# Patient Record
Sex: Male | Born: 1955 | Race: White | Hispanic: No | Marital: Married | Smoking: Former smoker
Health system: Southern US, Community
[De-identification: ages and names within clinical notes are randomized; demographics above are authoritative.]

## PROBLEM LIST (undated history)

## (undated) DIAGNOSIS — I10 Essential (primary) hypertension: Secondary | ICD-10-CM

## (undated) DIAGNOSIS — F419 Anxiety disorder, unspecified: Secondary | ICD-10-CM

## (undated) DIAGNOSIS — K219 Gastro-esophageal reflux disease without esophagitis: Secondary | ICD-10-CM

## (undated) DIAGNOSIS — F32A Depression, unspecified: Secondary | ICD-10-CM

## (undated) DIAGNOSIS — S43004A Unspecified dislocation of right shoulder joint, initial encounter: Secondary | ICD-10-CM

## (undated) DIAGNOSIS — F329 Major depressive disorder, single episode, unspecified: Secondary | ICD-10-CM

## (undated) DIAGNOSIS — M1990 Unspecified osteoarthritis, unspecified site: Secondary | ICD-10-CM

## (undated) DIAGNOSIS — E7800 Pure hypercholesterolemia, unspecified: Secondary | ICD-10-CM

## (undated) HISTORY — PX: COLONOSCOPY: SHX174

---

## 2003-05-02 HISTORY — PX: ROTATOR CUFF REPAIR: SHX139

## 2003-08-19 ENCOUNTER — Ambulatory Visit (HOSPITAL_COMMUNITY): Admission: RE | Admit: 2003-08-19 | Discharge: 2003-08-19 | Payer: Self-pay | Admitting: Unknown Physician Specialty

## 2011-04-23 ENCOUNTER — Emergency Department (HOSPITAL_COMMUNITY): Payer: Self-pay

## 2011-04-23 ENCOUNTER — Encounter: Payer: Self-pay | Admitting: *Deleted

## 2011-04-23 ENCOUNTER — Emergency Department (HOSPITAL_COMMUNITY)
Admission: EM | Admit: 2011-04-23 | Discharge: 2011-04-23 | Disposition: A | Discharge status: Home / Self Care | Payer: Self-pay | Attending: Emergency Medicine | Admitting: Emergency Medicine

## 2011-04-23 DIAGNOSIS — R0989 Other specified symptoms and signs involving the circulatory and respiratory systems: Secondary | ICD-10-CM | POA: Insufficient documentation

## 2011-04-23 DIAGNOSIS — J3489 Other specified disorders of nose and nasal sinuses: Secondary | ICD-10-CM | POA: Insufficient documentation

## 2011-04-23 DIAGNOSIS — Z8639 Personal history of other endocrine, nutritional and metabolic disease: Secondary | ICD-10-CM | POA: Insufficient documentation

## 2011-04-23 DIAGNOSIS — R05 Cough: Secondary | ICD-10-CM | POA: Insufficient documentation

## 2011-04-23 DIAGNOSIS — F3289 Other specified depressive episodes: Secondary | ICD-10-CM | POA: Insufficient documentation

## 2011-04-23 DIAGNOSIS — Z862 Personal history of diseases of the blood and blood-forming organs and certain disorders involving the immune mechanism: Secondary | ICD-10-CM | POA: Insufficient documentation

## 2011-04-23 DIAGNOSIS — R093 Abnormal sputum: Secondary | ICD-10-CM | POA: Insufficient documentation

## 2011-04-23 DIAGNOSIS — E789 Disorder of lipoprotein metabolism, unspecified: Secondary | ICD-10-CM | POA: Insufficient documentation

## 2011-04-23 DIAGNOSIS — J189 Pneumonia, unspecified organism: Secondary | ICD-10-CM | POA: Insufficient documentation

## 2011-04-23 DIAGNOSIS — R062 Wheezing: Secondary | ICD-10-CM | POA: Insufficient documentation

## 2011-04-23 DIAGNOSIS — I10 Essential (primary) hypertension: Secondary | ICD-10-CM | POA: Insufficient documentation

## 2011-04-23 DIAGNOSIS — R0602 Shortness of breath: Secondary | ICD-10-CM | POA: Insufficient documentation

## 2011-04-23 DIAGNOSIS — R059 Cough, unspecified: Secondary | ICD-10-CM | POA: Insufficient documentation

## 2011-04-23 DIAGNOSIS — F329 Major depressive disorder, single episode, unspecified: Secondary | ICD-10-CM | POA: Insufficient documentation

## 2011-04-23 HISTORY — DX: Pure hypercholesterolemia, unspecified: E78.00

## 2011-04-23 HISTORY — DX: Depression, unspecified: F32.A

## 2011-04-23 HISTORY — DX: Essential (primary) hypertension: I10

## 2011-04-23 HISTORY — DX: Major depressive disorder, single episode, unspecified: F32.9

## 2011-04-23 MED ORDER — MOXIFLOXACIN HCL 400 MG PO TABS: 400.0000 mg | ORAL_TABLET | Freq: Every day | ORAL | Status: AC

## 2011-04-23 NOTE — ED Notes (Signed)
Pt d/c home with wife. Rx given and reviewed.

## 2011-04-23 NOTE — ED Provider Notes (Signed)
History   This chart was scribed for Toy Baker, MD by Charolett Bumpers. The patient was seen in room APA19/APA19 and the patient's care was started at 7:15am.  CSN: [id number]  Arrival date & time 04/23/11  4098   First MD Initiated Contact with Patient 04/23/11 [phone]      Chief Complaint  Patient presents with  . Cough    (Consider location/radiation/quality/duration/timing/severity/associated sxs/prior treatment) HPI Jeff Shaw is a 55 y.o. male who presents to the Emergency Department complaining of a moderate, constant cough that is productive with green/yellow sputum onset 5 days ago and persistent since with associated sinus drainage, wheezing, chest congestion and SOB. The SOB is made worse by lying flat and relieved by nothing. He denies fever, vomiting, diarrhea, leg swelling, and chest pain. Patient reports recent sick contacts with coworkers.  Pt denies taking any meds pta  Past Medical History  Diagnosis Date  . Hypertension   . High cholesterol   . Gout   . Depression     Past Surgical History  Procedure Date  . Rotator cuff repair     right shoulder    No family history on file.  History  Substance Use Topics  . Smoking status: Never Smoker   . Smokeless tobacco: Not on file  . Alcohol Use: No      Review of Systems A complete 10 system review of systems was obtained and is otherwise negative except as noted in the HPI and PMH.    Allergies  Review of patient's allergies indicates no known allergies.  Home Medications  No current outpatient prescriptions on file.  BP 149/93  Pulse 62  Temp(Src) 97.3 F (36.3 C) (Oral)  Resp 20  Ht 5\' 10"  (1.778 m)  Wt 190 lb (86.183 kg)  BMI 27.26 kg/m2  SpO2 97%  Physical Exam  Nursing note and vitals reviewed. Constitutional: He is oriented to person, place, and time. He appears well-developed and well-nourished. No distress.  HENT:  Head: Normocephalic and atraumatic.  Mouth/Throat:  Oropharynx is clear and moist.  Eyes: EOM are normal. Pupils are equal, round, and reactive to light.  Neck: Neck supple. No tracheal deviation present.  Cardiovascular: Normal rate and normal heart sounds.  Exam reveals no gallop and no friction rub.   No murmur heard. Pulmonary/Chest: Effort normal. No respiratory distress.       Rhonchi at right base.  Abdominal: Soft. He exhibits no distension.  Musculoskeletal: Normal range of motion. He exhibits no edema.  Lymphadenopathy:    He has no cervical adenopathy.  Neurological: He is alert and oriented to person, place, and time. No sensory deficit.  Skin: Skin is warm and dry.  Psychiatric: He has a normal mood and affect. His behavior is normal.    ED Course  Procedures (including critical care time) DIAGNOSTIC STUDIES: Oxygen Saturation is 97% on room air, normal by my interpretation.    COORDINATION OF CARE:     Labs Reviewed - No data to display Dg Chest 2 View  04/23/2011  *RADIOLOGY REPORT*  Clinical Data: Pain, cough.  CHEST - 2 VIEW  Comparison: None.  Findings: Lingular opacity, atelectasis versus infiltrate.  Right lung is clear.  Heart is borderline in size.  No effusions or acute bony abnormality.  IMPRESSION: Lingular atelectasis or pneumonia.  Original Report Authenticated By: Cyndie Chime, M.D.     No diagnosis found.    MDM  Patient's x-rays reviewed and findings consistent with pneumonia will  place patient on antibiotics for this  I personally performed the services described in this documentation, which was scribed in my presence. The recorded information has been reviewed and considered.       Toy Baker, MD 04/23/11 [phone]

## 2011-04-23 NOTE — ED Notes (Signed)
Increased cough, wheezing, increased sob when lying flat, and chest congestion x 5 days.  Denies fever.

## 2012-01-05 ENCOUNTER — Encounter (HOSPITAL_COMMUNITY): Payer: Self-pay | Admitting: *Deleted

## 2012-01-05 ENCOUNTER — Emergency Department (HOSPITAL_COMMUNITY)
Admission: EM | Admit: 2012-01-05 | Discharge: 2012-01-06 | Disposition: A | Discharge status: Home / Self Care | Payer: Self-pay | Attending: Emergency Medicine | Admitting: Emergency Medicine

## 2012-01-05 DIAGNOSIS — T46905A Adverse effect of unspecified agents primarily affecting the cardiovascular system, initial encounter: Secondary | ICD-10-CM | POA: Insufficient documentation

## 2012-01-05 DIAGNOSIS — I10 Essential (primary) hypertension: Secondary | ICD-10-CM | POA: Insufficient documentation

## 2012-01-05 DIAGNOSIS — T7840XA Allergy, unspecified, initial encounter: Secondary | ICD-10-CM | POA: Insufficient documentation

## 2012-01-05 DIAGNOSIS — M109 Gout, unspecified: Secondary | ICD-10-CM | POA: Insufficient documentation

## 2012-01-05 DIAGNOSIS — T783XXA Angioneurotic edema, initial encounter: Secondary | ICD-10-CM

## 2012-01-05 DIAGNOSIS — E7800 Pure hypercholesterolemia, unspecified: Secondary | ICD-10-CM | POA: Insufficient documentation

## 2012-01-05 MED ORDER — DIPHENHYDRAMINE HCL 50 MG/ML IJ SOLN
25.0000 mg | Freq: Once | INTRAMUSCULAR | Status: AC
  Administered 2012-01-05: 25 mg via INTRAVENOUS
  Filled 2012-01-05: qty 1

## 2012-01-05 MED ORDER — METHYLPREDNISOLONE SODIUM SUCC 125 MG IJ SOLR
125.0000 mg | Freq: Once | INTRAMUSCULAR | Status: AC
  Administered 2012-01-05: 125 mg via INTRAVENOUS
  Filled 2012-01-05: qty 2

## 2012-01-05 MED ORDER — FAMOTIDINE IN NACL 20-0.9 MG/50ML-% IV SOLN
20.0000 mg | Freq: Once | INTRAVENOUS | Status: AC
  Administered 2012-01-05: 20 mg via INTRAVENOUS
  Filled 2012-01-05: qty 50

## 2012-01-05 NOTE — ED Provider Notes (Signed)
History     CSN: [id number]  Arrival date & time 01/05/12  2233   First MD Initiated Contact with Patient 01/05/12 2241      Chief Complaint  Patient presents with  . Allergic Reaction    (Consider location/radiation/quality/duration/timing/severity/associated sxs/prior treatment) Patient is a 56 y.o. male presenting with allergic reaction. The history is provided by the patient (pt complains of swelling to face). No language interpreter was used.  Allergic Reaction The primary symptoms do not include wheezing, shortness of breath, cough, abdominal pain, diarrhea or rash. The current episode started less than 1 hour ago. The problem has not changed since onset.This is a new problem.  Associated with: nothing. Significant symptoms that are not present include eye redness.    Past Medical History  Diagnosis Date  . Hypertension   . High cholesterol   . Gout   . Depression     Past Surgical History  Procedure Date  . Rotator cuff repair     right shoulder    History reviewed. No pertinent family history.  History  Substance Use Topics  . Smoking status: Never Smoker   . Smokeless tobacco: Not on file  . Alcohol Use: No      Review of Systems  Constitutional: Negative for fatigue.  HENT: Positive for facial swelling. Negative for congestion, sinus pressure and ear discharge.   Eyes: Negative for discharge and redness.  Respiratory: Negative for cough, shortness of breath and wheezing.   Cardiovascular: Negative for chest pain.  Gastrointestinal: Negative for abdominal pain and diarrhea.  Genitourinary: Negative for frequency and hematuria.  Musculoskeletal: Negative for back pain.  Skin: Negative for rash.  Neurological: Negative for seizures and headaches.  Hematological: Negative.   Psychiatric/Behavioral: Negative for hallucinations.    Allergies  Review of patient's allergies indicates no known allergies.  Home Medications   Current Outpatient Rx    Name Route Sig Dispense Refill  . CITALOPRAM HYDROBROMIDE 10 MG PO TABS Oral Take 10 mg by mouth daily.    . IBUPROFEN 200 MG PO TABS Oral Take 600 mg by mouth every 6 (six) hours as needed. pain    . LISINOPRIL PO Oral Take 1 tablet by mouth daily. Patient unsure of strength    . PRAVASTATIN SODIUM 10 MG PO TABS Oral Take 10 mg by mouth daily.      BP 120/76  Pulse 76  Temp 98 F (36.7 C) (Oral)  Resp 20  SpO2 95%  Physical Exam  Constitutional: He is oriented to person, place, and time. He appears well-developed.  HENT:  Head: Normocephalic and atraumatic.       Pt has swelling to both lips and his chin and some swelling to tongue  Eyes: Conjunctivae and EOM are normal. No scleral icterus.  Neck: Neck supple. No thyromegaly present.  Cardiovascular: Normal rate and regular rhythm.  Exam reveals no gallop and no friction rub.   No murmur heard. Pulmonary/Chest: No stridor. He has no wheezes. He has no rales. He exhibits no tenderness.  Abdominal: He exhibits no distension. There is no tenderness. There is no rebound.  Musculoskeletal: Normal range of motion. He exhibits no edema.  Lymphadenopathy:    He has no cervical adenopathy.  Neurological: He is oriented to person, place, and time. Coordination normal.  Skin: No rash noted. No erythema.  Psychiatric: He has a normal mood and affect. His behavior is normal.    ED Course  Procedures (including critical care time)  Labs Reviewed -  No data to display No results found.   No diagnosis found.   Pt improved with tx MDM  Allergic reaction to lisinopril        Benny Lennert, MD 01/05/12 [phone]

## 2012-01-05 NOTE — ED Notes (Signed)
Pt has noted red raised hives on chest, bil arms and legs; Pt c/o itching and burning

## 2012-01-05 NOTE — ED Notes (Addendum)
Pt c/o sob, throat swelling, redness and hives. Pt took 2 benadryl just pta and 2 vitamin c tablets. Wife states pt used [place] for the first time tonight

## 2012-01-05 NOTE — ED Notes (Signed)
Pt has slight tongue swelling and noted lip swelling; Airway WNL at this time Pt sat 91% on RA 02 applied for pt comfort;Pt had soap rxn "[place]" Pt took 2 benadryl prior to arrival and pt re washed after soap exposure. MD made aware of pt arrival MD ot room to assess

## 2012-01-06 MED ORDER — PREDNISONE 10 MG PO TABS: 20.0000 mg | ORAL_TABLET | Freq: Every day | ORAL | Status: DC

## 2012-01-06 NOTE — ED Notes (Signed)
Pt had decreased redness with decreased hives noted No distress noted

## 2012-01-06 NOTE — ED Notes (Signed)
Pt comfortable No distress assessed, decreased hives assessed; Pt sitting up on side of bed drinking a drink; denies pain; 02 sats 97% on 2L 02 removed

## 2012-01-06 NOTE — ED Notes (Signed)
Pt alert & oriented x4, stable gait. Patient given discharge instructions, paperwork & prescription(s). Patient  instructed to stop at the registration desk to finish any additional paperwork. Patient verbalized understanding. Pt left department w/ no further questions. 

## 2012-01-06 NOTE — ED Provider Notes (Signed)
2330 Assumed care/disposition of patient. Patient with angioedema due to lisinopril. Given solumedrol, benadryl, pepcid. Patient has been observed in the ER for 3 hours. Swelling has improved. He was able to drink fluids. Pt stable in ED with no significant deterioration in condition.The patient appears reasonably screened and/or stabilized for discharge and I doubt any other medical condition or other [facility] requiring further screening, evaluation, or treatment in the ED at this time prior to discharge.  Nicoletta Dress. Colon Branch, MD 01/06/12 [phone]

## 2014-01-24 ENCOUNTER — Encounter (HOSPITAL_COMMUNITY): Payer: Self-pay | Admitting: Emergency Medicine

## 2014-01-24 ENCOUNTER — Emergency Department (HOSPITAL_COMMUNITY)
Admission: EM | Admit: 2014-01-24 | Discharge: 2014-01-24 | Disposition: A | Discharge status: Home / Self Care | Payer: BC Managed Care – PPO | Attending: Emergency Medicine | Admitting: Emergency Medicine

## 2014-01-24 ENCOUNTER — Emergency Department (HOSPITAL_COMMUNITY): Payer: BC Managed Care – PPO

## 2014-01-24 DIAGNOSIS — Z9889 Other specified postprocedural states: Secondary | ICD-10-CM | POA: Diagnosis not present

## 2014-01-24 DIAGNOSIS — Z79899 Other long term (current) drug therapy: Secondary | ICD-10-CM | POA: Diagnosis not present

## 2014-01-24 DIAGNOSIS — S43004A Unspecified dislocation of right shoulder joint, initial encounter: Secondary | ICD-10-CM

## 2014-01-24 DIAGNOSIS — S46909A Unspecified injury of unspecified muscle, fascia and tendon at shoulder and upper arm level, unspecified arm, initial encounter: Secondary | ICD-10-CM | POA: Insufficient documentation

## 2014-01-24 DIAGNOSIS — F3289 Other specified depressive episodes: Secondary | ICD-10-CM | POA: Diagnosis not present

## 2014-01-24 DIAGNOSIS — S43016A Anterior dislocation of unspecified humerus, initial encounter: Secondary | ICD-10-CM | POA: Insufficient documentation

## 2014-01-24 DIAGNOSIS — Y9389 Activity, other specified: Secondary | ICD-10-CM | POA: Insufficient documentation

## 2014-01-24 DIAGNOSIS — W010XXA Fall on same level from slipping, tripping and stumbling without subsequent striking against object, initial encounter: Secondary | ICD-10-CM | POA: Insufficient documentation

## 2014-01-24 DIAGNOSIS — M109 Gout, unspecified: Secondary | ICD-10-CM | POA: Insufficient documentation

## 2014-01-24 DIAGNOSIS — F329 Major depressive disorder, single episode, unspecified: Secondary | ICD-10-CM | POA: Diagnosis not present

## 2014-01-24 DIAGNOSIS — E7800 Pure hypercholesterolemia, unspecified: Secondary | ICD-10-CM | POA: Diagnosis not present

## 2014-01-24 DIAGNOSIS — Z791 Long term (current) use of non-steroidal anti-inflammatories (NSAID): Secondary | ICD-10-CM | POA: Diagnosis not present

## 2014-01-24 DIAGNOSIS — I10 Essential (primary) hypertension: Secondary | ICD-10-CM | POA: Diagnosis not present

## 2014-01-24 DIAGNOSIS — Y9289 Other specified places as the place of occurrence of the external cause: Secondary | ICD-10-CM | POA: Insufficient documentation

## 2014-01-24 DIAGNOSIS — S4980XA Other specified injuries of shoulder and upper arm, unspecified arm, initial encounter: Secondary | ICD-10-CM | POA: Insufficient documentation

## 2014-01-24 MED ORDER — HYDROCODONE-ACETAMINOPHEN 5-325 MG PO TABS: 1.0000 | ORAL_TABLET | ORAL | Status: DC | PRN

## 2014-01-24 MED ORDER — ONDANSETRON HCL 4 MG/2ML IJ SOLN
INTRAMUSCULAR | Status: AC
  Filled 2014-01-24: qty 2

## 2014-01-24 MED ORDER — HYDROMORPHONE HCL 1 MG/ML IJ SOLN
1.0000 mg | Freq: Once | INTRAMUSCULAR | Status: AC
  Administered 2014-01-24: 1 mg via INTRAVENOUS
  Filled 2014-01-24: qty 1

## 2014-01-24 MED ORDER — PROPOFOL 10 MG/ML IV BOLUS
INTRAVENOUS | Status: AC | PRN
  Administered 2014-01-24 (×2): 40 mg via INTRAVENOUS

## 2014-01-24 MED ORDER — PROPOFOL 10 MG/ML IV BOLUS
200.0000 mg | Freq: Once | INTRAVENOUS | Status: DC
  Filled 2014-01-24: qty 1

## 2014-01-24 MED ORDER — ONDANSETRON HCL 4 MG/2ML IJ SOLN
4.0000 mg | Freq: Once | INTRAMUSCULAR | Status: AC
  Administered 2014-01-24: 4 mg via INTRAVENOUS
  Filled 2014-01-24: qty 2

## 2014-01-24 MED ORDER — HYDROMORPHONE HCL 1 MG/ML IJ SOLN
INTRAMUSCULAR | Status: AC
  Filled 2014-01-24: qty 1

## 2014-01-24 MED ORDER — ONDANSETRON HCL 4 MG/2ML IJ SOLN
INTRAMUSCULAR | Status: DC | PRN
  Administered 2014-01-24: 4 mg via INTRAVENOUS

## 2014-01-24 MED ORDER — HYDROMORPHONE HCL 1 MG/ML IJ SOLN
INTRAMUSCULAR | Status: DC | PRN
  Administered 2014-01-24: 1 mg via INTRAVENOUS

## 2014-01-24 NOTE — ED Provider Notes (Signed)
CSN: [id number]     Arrival date & time 01/24/14  1045 History   First MD Initiated Contact with Patient 01/24/14 1056     Chief Complaint  Patient presents with  . Shoulder Pain     (Consider location/radiation/quality/duration/timing/severity/associated sxs/prior Treatment) The history is provided by the patient and the spouse.   Jeff Shaw is a 58 y.o. male right handed, with a history of right rotator cuff repair 7 years ago.  Since this event he has had intermittent episodes of shoulder dislocations, sometimes with simply raising his arm over his head, describing has already woke at night after reaching under his pillow with dislocation.  He can usually reduce it using gravity.  This morning he was pushing a lawn mower up an incline, his feet slipped causing a fall while still hanging onto the mower.  He has been unable to reduce the shoulder on his own.  He has had no medicines prior to arrival for this injury.  He denies weakness or numbness in his arm.     Past Medical History  Diagnosis Date  . Hypertension   . High cholesterol   . Gout   . Depression    Past Surgical History  Procedure Laterality Date  . Rotator cuff repair      right shoulder   History reviewed. No pertinent family history. History  Substance Use Topics  . Smoking status: Never Smoker   . Smokeless tobacco: Not on file  . Alcohol Use: Yes     Comment: occassionally     Review of Systems  Constitutional: Negative for fever.  Musculoskeletal: Positive for arthralgias. Negative for joint swelling and myalgias.  Neurological: Negative for weakness and numbness.      Allergies  Review of patient's allergies indicates no known allergies.  Home Medications   Prior to Admission medications   Medication Sig Start Date End Date Taking? Authorizing Provider  citalopram (CELEXA) 20 MG tablet Take 20 mg by mouth daily.   Yes Historical Provider, MD  lisinopril (PRINIVIL,ZESTRIL) 10 MG tablet Take  10 mg by mouth daily.   Yes Historical Provider, MD  simvastatin (ZOCOR) 40 MG tablet Take 40 mg by mouth daily.   Yes Historical Provider, MD  HYDROcodone-acetaminophen (NORCO/VICODIN) 5-325 MG per tablet Take 1 tablet by mouth every 4 (four) hours as needed. 01/24/14   Burgess Amor, PA-C  ibuprofen (ADVIL,MOTRIN) 200 MG tablet Take 600 mg by mouth every 6 (six) hours as needed. pain    Historical Provider, MD   BP 128/88  Pulse 60  Temp(Src) 97.8 F (36.6 C) (Oral)  Resp 14  Ht  (1.753 m)  Wt 185 lb (83.915 kg)  BMI 27.31 kg/m2  SpO2 94% Physical Exam  Constitutional: He appears well-developed and well-nourished.  HENT:  Head: Atraumatic.  Neck: Normal range of motion.  Cardiovascular:  Pulses equal bilaterally  Musculoskeletal: He exhibits tenderness.       Right shoulder: He exhibits decreased range of motion, bony tenderness and deformity. He exhibits no spasm and normal pulse.  Able to appreciated humeral head anteriorly.  Neurological: He is alert. He has normal strength. He displays normal reflexes. No sensory deficit. He exhibits normal muscle tone.  Equal grip strength bilaterally.  Radial pulses full and equal.  Skin: Skin is warm and dry.  Psychiatric: He has a normal mood and affect.    ED Course  Reduction of dislocation Date/Time: 01/24/2014 2:12 PM Performed by: Burgess Amor Authorized by: Burgess Amor Consent:  Verbal consent obtained. Risks and benefits: risks, benefits and alternatives were discussed Consent given by: patient Patient identity confirmed: verbally with patient Patient sedated: per EDP procedural sedation note. Patient tolerance: Patient tolerated the procedure well with no immediate complications. Comments: Shoulder easily reduced with traction and extension of the shoulder.  Shoulder immobilizer applied.   (including critical care time) Labs Review Labs Reviewed - No data to display  Imaging Review Dg Shoulder Right  01/24/2014    CLINICAL DATA:  Status post reduction.  EXAM: RIGHT SHOULDER - 2+ VIEW  COMPARISON:  Same day.  FINDINGS: Anterior dislocation noted on prior exam has been successfully reduced. No fracture is noted.  IMPRESSION: Successful reduction of anterior dislocation noted on prior exam.   Electronically Signed   By: Roque Lias M.D.   On: 01/24/2014 14:27   Dg Shoulder Right  01/24/2014   CLINICAL DATA:  RIGHT shoulder pain, limited range of motion, obvious deformity, slipped and fell on gravel, fell with arm hyper extended, prior rotator cuff surgery and shoulder dislocation  EXAM: RIGHT SHOULDER - 2+ VIEW  COMPARISON:  MRI RIGHT shoulder 08/19/2003  FINDINGS: Prior resection of distal RIGHT clavicle.  Anterior glenohumeral dislocation.  Hill-Sachs deformity of the proximal humerus.  No additional fracture or bone destruction.  Visualized ribs intact.  RIGHT basilar atelectasis.  IMPRESSION: Anterior RIGHT glenohumeral dislocation with Hill-Sachs impaction deformity of the proximal RIGHT humerus.   Electronically Signed   By: Ulyses Southward M.D.   On: 01/24/2014 12:23     EKG Interpretation None      MDM   Final diagnoses:  Shoulder dislocation, right, initial encounter    Pt placed in sling.  Advised ice,  Hydrocodone prescribed.  Referral to ortho for recheck of injury this week. Pt and wife at bedside agree with plan.    Burgess Amor, PA-C 01/24/14 1731

## 2014-01-24 NOTE — Discharge Instructions (Signed)
Shoulder Dislocation Your shoulder is made up of three bones: the collar bone (clavicle); the shoulder blade (scapula), which includes the socket (glenoid cavity); and the upper arm bone (humerus). Your shoulder joint is the place where these bones meet. Strong, fibrous tissues hold these bones together (ligaments). Muscles and strong, fibrous tissues that connect the muscles to these bones (tendons) allow your arm to move through this joint. The range of motion of your shoulder joint is more extensive than most of your other joints, and the glenoid cavity is very shallow. That is the reason that your shoulder joint is one of the most unstable joints in your body. It is far more prone to dislocation than your other joints. Shoulder dislocation is when your humerus is forced out of your shoulder joint. CAUSES Shoulder dislocation is caused by a forceful impact on your shoulder. This impact usually is from an injury, such as a sports injury or a fall. SYMPTOMS Symptoms of shoulder dislocation include:  Deformity of your shoulder.  Intense pain.  Inability to move your shoulder joint.  Numbness, weakness, or tingling around your shoulder joint (your neck or down your arm).  Bruising or swelling around your shoulder. DIAGNOSIS In order to diagnose a dislocated shoulder, your caregiver will perform a physical exam. Your caregiver also may have an X-ray exam done to see if you have any broken bones. Magnetic resonance imaging (MRI) is a procedure that sometimes is done to help your caregiver see any damage to the soft tissues around your shoulder, particularly your rotator cuff tendons. Additionally, your caregiver also may have electromyography done to measure the electrical discharges produced in your muscles if you have signs or symptoms of nerve damage. TREATMENT A shoulder dislocation is treated by placing the humerus back in the joint (reduction). Your caregiver does this either manually (closed  reduction), by moving your humerus back into the joint through manipulation, or through surgery (open reduction). When your humerus is back in place, severe pain should improve almost immediately. You also may need to have surgery if you have a weak shoulder joint or ligaments, and you have recurring shoulder dislocations, despite rehabilitation. In rare cases, surgery is necessary if your nerves or blood vessels are damaged during the dislocation. After your reduction, your arm will be placed in a shoulder immobilizer or sling to keep it from moving. Your caregiver will have you wear your shoulder immobilizer or sling for 3 days to 3 weeks, depending on how serious your dislocation is. When your shoulder immobilizer or sling is removed, your caregiver may prescribe physical therapy to help improve the range of motion in your shoulder joint. HOME CARE INSTRUCTIONS  The following measures can help to reduce pain and speed up the healing process:  Rest your injured joint. Do not move it. Avoid activities similar to the one that caused your injury.  Apply ice to your injured joint for the first day or two after your reduction or as directed by your caregiver. Applying ice helps to reduce inflammation and pain.  Put ice in a plastic bag.  Place a towel between your skin and the bag.  Leave the ice on for 15-20 minutes at a time, every 2 hours while you are awake.  Exercise your hand by squeezing a soft ball. This helps to eliminate stiffness and swelling in your hand and wrist.  Take over-the-counter or prescription medicine for pain or discomfort as told by your caregiver. SEEK IMMEDIATE MEDICAL CARE IF:   Your  shoulder immobilizer or sling becomes damaged.  Your pain becomes worse rather than better.  You lose feeling in your arm or hand, or they become white and cold. MAKE SURE YOU:   Understand these instructions.  Will watch your condition.  Will get help right away if you are not  doing well or get worse. Document Released: 01/10/2001 Document Revised: 09/01/2013 Document Reviewed: 02/05/2011 [facility] Patient Information 2015 [place], [place]. This information is not intended to replace advice given to you by your health care provider. Make sure you discuss any questions you have with your health care provider.  Wear your shoulder immobilizer to protect the joint from dislocating, which is at increased risk until the ligaments heal from today's trauma.  Call Dr. Romeo Apple this week for a recheck of your shoulder and further management of your recurrent dislocations.  You may take the hydrocodone prescribed for pain relief.  This will make you drowsy - do not drive within 4 hours of taking this medication.

## 2014-01-24 NOTE — ED Notes (Signed)
Pt states he was pushing his lawn mower a slipped on the gravel. He fell and landed on his right shoulder. Reports he feels like it is popped out of joint. With history of same.

## 2014-01-24 NOTE — ED Provider Notes (Signed)
Pt reports he had rotator cuff surgery about 7 years ago and afterward he had an injury where he fell backwards down some steps while holding onto a cart and dislocated both his shoulders which he was able to reduce himself.  He reports frequent episodes of his shoulders dislocating that he is able to reduce himself "using gravity" he states today someone was trying to get a lawnmower up somewhat cramps and he went to help push the lawnmower. His speech however slipped and he fell in his right shoulder dislocated. He states he's unable to get it back into joint today.  The patient has obvious step off of his right shoulder, he has good distal pulses.  Shoulder was reduced by PA Idol, pt placed in shoulder immobilizer afterwards. His shoulder feels like it has been reduced.   Procedural sedation Performed by: Devoria Albe L Consent: Verbal consent obtained. Risks and benefits: risks, benefits and alternatives were discussed Required items: required blood products, implants, devices, and special equipment available Patient identity confirmed: arm band and provided demographic data Time out: Immediately prior to procedure a "time out" was called to verify the correct patient, procedure, equipment, support staff and site/side marked as required.  Sedation type: moderate (conscious) sedation NPO time confirmed and considedered  Pt had episode of apnea, his pulse ox started to drop and BMV was initiated, his pulse ox dropped briefly to low 80's, but he responded rapidly to BMV. He then took a few breaths and woke up talking.   Sedatives: PROPOFOL  Physician Time at Bedside: 30 min  Vitals: Vital signs were monitored during sedation. Cardiac Monitor, pulse oximeter Patient tolerance: Patient tolerated the procedure well with no immediate complications. Comments: Pt with uneventful recovered. Returned to pre-procedural sedation baseline  Medical screening examination/treatment/procedure(s) were  conducted as a shared visit with non-physician practitioner(s) and myself.  I personally evaluated the patient during the encounter.   EKG Interpretation None       Devoria Albe, MD, Armando Gang   Ward Givens, MD 01/24/14 1330

## 2014-01-24 NOTE — ED Notes (Signed)
MD at bedside. 

## 2014-01-24 NOTE — ED Notes (Signed)
PA at bedside.

## 2014-01-25 NOTE — ED Provider Notes (Signed)
See prior note   Ward Givens, MD 01/25/14 [phone]

## 2014-01-26 ENCOUNTER — Encounter: Payer: Self-pay | Admitting: Orthopedic Surgery

## 2014-01-26 ENCOUNTER — Ambulatory Visit (INDEPENDENT_AMBULATORY_CARE_PROVIDER_SITE_OTHER): Payer: BC Managed Care – PPO | Admitting: Orthopedic Surgery

## 2014-01-26 ENCOUNTER — Ambulatory Visit (INDEPENDENT_AMBULATORY_CARE_PROVIDER_SITE_OTHER): Payer: BC Managed Care – PPO

## 2014-01-26 VITALS — BP 138/87 | Ht 69.0 in | Wt 185.0 lb

## 2014-01-26 DIAGNOSIS — IMO0002 Reserved for concepts with insufficient information to code with codable children: Secondary | ICD-10-CM

## 2014-01-26 DIAGNOSIS — S43004S Unspecified dislocation of right shoulder joint, sequela: Secondary | ICD-10-CM

## 2014-01-26 DIAGNOSIS — M24419 Recurrent dislocation, unspecified shoulder: Secondary | ICD-10-CM

## 2014-01-26 DIAGNOSIS — M24411 Recurrent dislocation, right shoulder: Secondary | ICD-10-CM

## 2014-01-26 MED ORDER — HYDROCODONE-ACETAMINOPHEN 5-325 MG PO TABS: 1.0000 | ORAL_TABLET | ORAL | Status: DC | PRN

## 2014-01-26 NOTE — Patient Instructions (Addendum)
Shoulder Dislocation Your shoulder is made up of three bones: the collar bone (clavicle); the shoulder blade (scapula), which includes the socket (glenoid cavity); and the upper arm bone (humerus). Your shoulder joint is the place where these bones meet. Strong, fibrous tissues hold these bones together (ligaments). Muscles and strong, fibrous tissues that connect the muscles to these bones (tendons) allow your arm to move through this joint. The range of motion of your shoulder joint is more extensive than most of your other joints, and the glenoid cavity is very shallow. That is the reason that your shoulder joint is one of the most unstable joints in your body. It is far more prone to dislocation than your other joints. Shoulder dislocation is when your humerus is forced out of your shoulder joint. CAUSES Shoulder dislocation is caused by a forceful impact on your shoulder. This impact usually is from an injury, such as a sports injury or a fall. SYMPTOMS Symptoms of shoulder dislocation include:  Deformity of your shoulder.  Intense pain.  Inability to move your shoulder joint.  Numbness, weakness, or tingling around your shoulder joint (your neck or down your arm).  Bruising or swelling around your shoulder. DIAGNOSIS In order to diagnose a dislocated shoulder, your caregiver will perform a physical exam. Your caregiver also may have an X-ray exam done to see if you have any broken bones. Magnetic resonance imaging (MRI) is a procedure that sometimes is done to help your caregiver see any damage to the soft tissues around your shoulder, particularly your rotator cuff tendons. Additionally, your caregiver also may have electromyography done to measure the electrical discharges produced in your muscles if you have signs or symptoms of nerve damage. TREATMENT A shoulder dislocation is treated by placing the humerus back in the joint (reduction). Your caregiver does this either manually (closed  reduction), by moving your humerus back into the joint through manipulation, or through surgery (open reduction). When your humerus is back in place, severe pain should improve almost immediately. You also may need to have surgery if you have a weak shoulder joint or ligaments, and you have recurring shoulder dislocations, despite rehabilitation. In rare cases, surgery is necessary if your nerves or blood vessels are damaged during the dislocation. After your reduction, your arm will be placed in a shoulder immobilizer or sling to keep it from moving. Your caregiver will have you wear your shoulder immobilizer or sling for 3 days to 3 weeks, depending on how serious your dislocation is. When your shoulder immobilizer or sling is removed, your caregiver may prescribe physical therapy to help improve the range of motion in your shoulder joint. HOME CARE INSTRUCTIONS  The following measures can help to reduce pain and speed up the healing process:  Rest your injured joint. Do not move it. Avoid activities similar to the one that caused your injury.  Apply ice to your injured joint for the first day or two after your reduction or as directed by your caregiver. Applying ice helps to reduce inflammation and pain.  Put ice in a plastic bag.  Place a towel between your skin and the bag.  Leave the ice on for 15-20 minutes at a time, every 2 hours while you are awake.  Exercise your hand by squeezing a soft ball. This helps to eliminate stiffness and swelling in your hand and wrist.  Take over-the-counter or prescription medicine for pain or discomfort as told by your caregiver. SEEK IMMEDIATE MEDICAL CARE IF:   Your  shoulder immobilizer or sling becomes damaged.  Your pain becomes worse rather than better.  You lose feeling in your arm or hand, or they become white and cold. MAKE SURE YOU:   Understand these instructions.  Will watch your condition.  Will get help right away if you are not  doing well or get worse. Document Released: 01/10/2001 Document Revised: 09/01/2013 Document Reviewed: 02/05/2011 [facility] Patient Information 2015 [place], [place]. This information is not intended to replace advice given to you by your health care provider. Make sure you discuss any questions you have with your health care provi   Immobilize 2 weeks Out of work 2 weeks

## 2014-01-26 NOTE — Progress Notes (Signed)
The patient new problem  Right-hand-dominant 58 year old male presents with history of recurrent shoulder dislocations. He's had 4 dislocations of his right shoulder the first 3 times he reduce the shoulder himself on this occasion 3 days ago on 01/24/2014 he fell in his shoulder came out of place and he couldn't reduce it.  He complains of numbness tingling, aching throbbing pain in the right shoulder which is constant. He rates his pain 5/10 and is on hydrocodone 5 mg as needed.  Previous treatment again did not including immobilization and he did self reduction. 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 His first shoulder dislocation occurred after that.  Medical history hypertension depression OCD ADHD arthritis  No other surgeries current medications lisinopril citalopram and simvastatin  History of review of systems indicating fatigue hearing loss frequent urination nausea depression joint pain muscle weakness lightheadedness tingling and numbness right upper extremity  Medication allergies none  Smoking history negative drinking history social drug history negative  Family history hypertension depression arthritis  Records reviewed ED notes  BP 138/87  Ht  (1.753 m)  Wt 185 lb (83.915 kg)  BMI 27.31 kg/m2  General appearance is normal orientation x3 is normal mood and affect are normal. Gait and station are normal. Right and left lower extremity inspection reveals no gross abnormalities in terms of alignment. There are no contractures, instability subluxations, atrophy or tremors. Neurovascular exam is intact  Left shoulder show some mild impingement signs but full range of motion normal strength stability alignment and skin is normal good pulse and temperature are noted normal sensation without lymphadenopathy in the axilla cervical spine or supraclavicular region  Right  shoulder has glenohumeral joint line tenderness anteriorly limited external rotation at 30 not sure if it's acute or chronic abduction 90 abduction external rotation 90-20 limited by pain muscle tone normal skin normal pulses normal lymph nodes normal sensation normal  Hoffman test is negative and coordination is normal  X-rays were done at the hospital they show a dislocated shoulder they show what appears to be a reduction I repeated the films with a Grashey view and a scapular Y-view showing reduction with humeral head defect and cyst formation in the greater tuberosity which may or may not be related to cuff repair  Multiple dislocating right shoulder most recent traumatic  Reduced in the ER  Recommend immobilization for 2 weeks then return for reexamination and reevaluation of rotator cuff or tear and then make determination regarding possible referral to address a Hill-Sachs lesion

## 2014-02-09 ENCOUNTER — Encounter: Payer: Self-pay | Admitting: Orthopedic Surgery

## 2014-02-09 ENCOUNTER — Other Ambulatory Visit: Payer: Self-pay | Admitting: *Deleted

## 2014-02-09 ENCOUNTER — Telehealth: Payer: Self-pay | Admitting: Orthopedic Surgery

## 2014-02-09 ENCOUNTER — Telehealth: Payer: Self-pay | Admitting: *Deleted

## 2014-02-09 ENCOUNTER — Ambulatory Visit (INDEPENDENT_AMBULATORY_CARE_PROVIDER_SITE_OTHER): Payer: BC Managed Care – PPO | Admitting: Orthopedic Surgery

## 2014-02-09 VITALS — BP 140/96 | Ht 69.0 in | Wt 185.0 lb

## 2014-02-09 DIAGNOSIS — M24411 Recurrent dislocation, right shoulder: Secondary | ICD-10-CM

## 2014-02-09 DIAGNOSIS — S43004S Unspecified dislocation of right shoulder joint, sequela: Secondary | ICD-10-CM

## 2014-02-09 NOTE — Telephone Encounter (Signed)
Patient has decided to move forward with scheduling the surgery discussed at his office visit today, 02/09/14.  Please advise.  Patient ph#'s are: [phone] [facility](Home) And [phone] [facility]([place])

## 2014-02-09 NOTE — Telephone Encounter (Signed)
Patient is being referred to Dr Dion SaucierLandau for surgery

## 2014-02-09 NOTE — Progress Notes (Signed)
Chief Complaint  Patient presents with  . Follow-up    2 week follow up Rt shoulder dislocation, DOI 01/24/14    Review of this patient's history this is a 58 year old right-hand-dominant male who's had multiple dislocations of his right shoulder presents with a history of dislocation on 01/24/2014. On that occasion he required manipulation in the emergency room under anesthesia. The 3 previous dislocations he reduced himself. He was noted at the time of his dislocation that has severe Hill-Sachs lesion.  After 2 weeks of immobilization he presented back for reevaluation  He denies any numbness or tingling he does note issues regarding where he can put his arm where he feels a feeling of possible dislocation  His rotator cuff strength today is very good I would grade 5. He has limited external rotation which is chronic and I measure it with his arm at his side 25. BP 140/96  Ht 5\' 9"  (1.753 m)  Wt 185 lb (83.915 kg)  BMI 27.31 kg/m2 He is awake alert and oriented x3 his mood and affect are normal he is somewhat anxious his gait and station are intact and normal as well. He has no tenderness or swelling over the shoulder. Pulse and perfusion are intact and sensation is normal  I discussed with him and his wife that he has a Hill-Sachs lesion and that when his shoulder rotates to a certain position it is likely to dislocate into the Hill-Sachs lesion.  I've advised him that he can live with his shoulder the way it is or we can send him for further evaluation with a shoulder subspecialist for evaluation for possible replacement or tendon transfer into the defect  [facility] Dr. Dion SaucierLandau

## 2014-02-09 NOTE — Telephone Encounter (Signed)
[facility]

## 2014-03-09 NOTE — Telephone Encounter (Signed)
Had appointment with DR Dion SaucierLANDAU 02/18/14

## 2014-03-10 ENCOUNTER — Encounter (HOSPITAL_BASED_OUTPATIENT_CLINIC_OR_DEPARTMENT_OTHER)
Admission: RE | Admit: 2014-03-10 | Discharge: 2014-03-10 | Disposition: A | Discharge status: Home / Self Care | Payer: BC Managed Care – PPO | Source: Ambulatory Visit | Attending: Orthopedic Surgery | Admitting: Orthopedic Surgery

## 2014-03-10 ENCOUNTER — Encounter (HOSPITAL_BASED_OUTPATIENT_CLINIC_OR_DEPARTMENT_OTHER): Payer: Self-pay | Admitting: *Deleted

## 2014-03-10 DIAGNOSIS — F329 Major depressive disorder, single episode, unspecified: Secondary | ICD-10-CM | POA: Diagnosis not present

## 2014-03-10 DIAGNOSIS — M24411 Recurrent dislocation, right shoulder: Secondary | ICD-10-CM | POA: Diagnosis not present

## 2014-03-10 DIAGNOSIS — M25311 Other instability, right shoulder: Secondary | ICD-10-CM | POA: Diagnosis not present

## 2014-03-10 DIAGNOSIS — W19XXXS Unspecified fall, sequela: Secondary | ICD-10-CM | POA: Diagnosis not present

## 2014-03-10 DIAGNOSIS — M109 Gout, unspecified: Secondary | ICD-10-CM | POA: Diagnosis not present

## 2014-03-10 DIAGNOSIS — E780 Pure hypercholesterolemia: Secondary | ICD-10-CM | POA: Diagnosis not present

## 2014-03-10 DIAGNOSIS — I10 Essential (primary) hypertension: Secondary | ICD-10-CM | POA: Diagnosis not present

## 2014-03-10 DIAGNOSIS — M75101 Unspecified rotator cuff tear or rupture of right shoulder, not specified as traumatic: Secondary | ICD-10-CM | POA: Diagnosis not present

## 2014-03-10 DIAGNOSIS — Z888 Allergy status to other drugs, medicaments and biological substances status: Secondary | ICD-10-CM | POA: Diagnosis not present

## 2014-03-10 LAB — BASIC METABOLIC PANEL
Anion gap: 15 (ref 5–15)
BUN: 9 mg/dL (ref 6–23)
CALCIUM: 9.7 mg/dL (ref 8.4–10.5)
CHLORIDE: 102 meq/L (ref 96–112)
CO2: 23 meq/L (ref 19–32)
CREATININE: 0.9 mg/dL (ref 0.50–1.35)
GFR calc Af Amer: >90 mL/min (ref >90)
GFR calc non Af Amer: >90 mL/min (ref >90)
GLUCOSE: 108 mg/dL — AB (ref 70–99)
Potassium: 4.7 mEq/L (ref 3.7–5.3)
Sodium: 140 mEq/L (ref 137–147)

## 2014-03-10 NOTE — Progress Notes (Signed)
Pt here for labs ekg-called dr Garner Nashdaniels office for notes

## 2014-03-13 ENCOUNTER — Ambulatory Visit (HOSPITAL_BASED_OUTPATIENT_CLINIC_OR_DEPARTMENT_OTHER): Payer: BC Managed Care – PPO | Admitting: Certified Registered"

## 2014-03-13 ENCOUNTER — Ambulatory Visit (HOSPITAL_BASED_OUTPATIENT_CLINIC_OR_DEPARTMENT_OTHER)
Admission: RE | Admit: 2014-03-13 | Discharge: 2014-03-13 | Disposition: A | Discharge status: Home / Self Care | Payer: BC Managed Care – PPO | Source: Ambulatory Visit | Attending: Orthopedic Surgery | Admitting: Orthopedic Surgery

## 2014-03-13 ENCOUNTER — Encounter (HOSPITAL_BASED_OUTPATIENT_CLINIC_OR_DEPARTMENT_OTHER)
Admission: RE | Disposition: A | Discharge status: Home / Self Care | Payer: Self-pay | Source: Ambulatory Visit | Attending: Orthopedic Surgery

## 2014-03-13 ENCOUNTER — Encounter (HOSPITAL_BASED_OUTPATIENT_CLINIC_OR_DEPARTMENT_OTHER): Payer: Self-pay | Admitting: Certified Registered"

## 2014-03-13 DIAGNOSIS — M25311 Other instability, right shoulder: Secondary | ICD-10-CM | POA: Insufficient documentation

## 2014-03-13 DIAGNOSIS — E780 Pure hypercholesterolemia: Secondary | ICD-10-CM | POA: Insufficient documentation

## 2014-03-13 DIAGNOSIS — M24411 Recurrent dislocation, right shoulder: Secondary | ICD-10-CM | POA: Insufficient documentation

## 2014-03-13 DIAGNOSIS — I10 Essential (primary) hypertension: Secondary | ICD-10-CM | POA: Insufficient documentation

## 2014-03-13 DIAGNOSIS — M75121 Complete rotator cuff tear or rupture of right shoulder, not specified as traumatic: Secondary | ICD-10-CM

## 2014-03-13 DIAGNOSIS — W19XXXS Unspecified fall, sequela: Secondary | ICD-10-CM | POA: Insufficient documentation

## 2014-03-13 DIAGNOSIS — M109 Gout, unspecified: Secondary | ICD-10-CM | POA: Insufficient documentation

## 2014-03-13 DIAGNOSIS — Z888 Allergy status to other drugs, medicaments and biological substances status: Secondary | ICD-10-CM | POA: Insufficient documentation

## 2014-03-13 DIAGNOSIS — M75101 Unspecified rotator cuff tear or rupture of right shoulder, not specified as traumatic: Secondary | ICD-10-CM | POA: Insufficient documentation

## 2014-03-13 DIAGNOSIS — S43004A Unspecified dislocation of right shoulder joint, initial encounter: Secondary | ICD-10-CM

## 2014-03-13 DIAGNOSIS — F329 Major depressive disorder, single episode, unspecified: Secondary | ICD-10-CM | POA: Insufficient documentation

## 2014-03-13 HISTORY — DX: Unspecified dislocation of right shoulder joint, initial encounter: S43.004A

## 2014-03-13 HISTORY — PX: SHOULDER ARTHROSCOPY WITH ROTATOR CUFF REPAIR AND SUBACROMIAL DECOMPRESSION: SHX5686

## 2014-03-13 HISTORY — PX: SHOULDER ARTHROSCOPY WITH BANKART REPAIR: SHX5673

## 2014-03-13 SURGERY — SHOULDER ARTHROSCOPY WITH BANKART REPAIR
Anesthesia: Regional | Site: Shoulder | Laterality: Right

## 2014-03-13 MED ORDER — LACTATED RINGERS IV SOLN
INTRAVENOUS | Status: DC
  Administered 2014-03-13 (×2): via INTRAVENOUS

## 2014-03-13 MED ORDER — MIDAZOLAM HCL 2 MG/2ML IJ SOLN
INTRAMUSCULAR | Status: AC
  Filled 2014-03-13: qty 2

## 2014-03-13 MED ORDER — GLYCOPYRROLATE 0.2 MG/ML IJ SOLN
INTRAMUSCULAR | Status: DC | PRN
  Administered 2014-03-13: 0.2 mg via INTRAVENOUS

## 2014-03-13 MED ORDER — HYDROMORPHONE HCL 1 MG/ML IJ SOLN
0.2500 mg | INTRAMUSCULAR | Status: DC | PRN
Start: 2014-03-13 — End: 2014-03-13

## 2014-03-13 MED ORDER — OXYCODONE HCL 5 MG/5ML PO SOLN: 5.0000 mg | Freq: Once | ORAL | Status: DC | PRN

## 2014-03-13 MED ORDER — SENNA-DOCUSATE SODIUM 8.6-50 MG PO TABS: 2.0000 | ORAL_TABLET | Freq: Every day | ORAL | Status: DC

## 2014-03-13 MED ORDER — CEFAZOLIN SODIUM-DEXTROSE 2-3 GM-% IV SOLR
2.0000 g | INTRAVENOUS | Status: AC
  Administered 2014-03-13: 2 g via INTRAVENOUS

## 2014-03-13 MED ORDER — BACLOFEN 10 MG PO TABS: 10.0000 mg | ORAL_TABLET | Freq: Three times a day (TID) | ORAL | Status: DC

## 2014-03-13 MED ORDER — FENTANYL CITRATE 0.05 MG/ML IJ SOLN
INTRAMUSCULAR | Status: AC
  Filled 2014-03-13: qty 2

## 2014-03-13 MED ORDER — ROPIVACAINE HCL 5 MG/ML IJ SOLN
INTRAMUSCULAR | Status: DC | PRN
  Administered 2014-03-13: 30 mL via PERINEURAL

## 2014-03-13 MED ORDER — PROPOFOL 10 MG/ML IV BOLUS
INTRAVENOUS | Status: DC | PRN
  Administered 2014-03-13: 150 mg via INTRAVENOUS

## 2014-03-13 MED ORDER — CEFAZOLIN SODIUM-DEXTROSE 2-3 GM-% IV SOLR
INTRAVENOUS | Status: AC
  Filled 2014-03-13: qty 50

## 2014-03-13 MED ORDER — FENTANYL CITRATE 0.05 MG/ML IJ SOLN
INTRAMUSCULAR | Status: AC
  Filled 2014-03-13: qty 6

## 2014-03-13 MED ORDER — SUCCINYLCHOLINE CHLORIDE 20 MG/ML IJ SOLN
INTRAMUSCULAR | Status: DC | PRN
  Administered 2014-03-13: 100 mg via INTRAVENOUS

## 2014-03-13 MED ORDER — DEXAMETHASONE SODIUM PHOSPHATE 4 MG/ML IJ SOLN
INTRAMUSCULAR | Status: DC | PRN
  Administered 2014-03-13: 10 mg via INTRAVENOUS

## 2014-03-13 MED ORDER — FENTANYL CITRATE 0.05 MG/ML IJ SOLN
50.0000 ug | INTRAMUSCULAR | Status: DC | PRN
  Administered 2014-03-13: 100 ug via INTRAVENOUS

## 2014-03-13 MED ORDER — ONDANSETRON HCL 4 MG/2ML IJ SOLN
INTRAMUSCULAR | Status: DC | PRN
  Administered 2014-03-13: 4 mg via INTRAVENOUS

## 2014-03-13 MED ORDER — MIDAZOLAM HCL 2 MG/2ML IJ SOLN
1.0000 mg | INTRAMUSCULAR | Status: DC | PRN
  Administered 2014-03-13: 2 mg via INTRAVENOUS

## 2014-03-13 MED ORDER — OXYCODONE-ACETAMINOPHEN 10-325 MG PO TABS: 1.0000 | ORAL_TABLET | Freq: Four times a day (QID) | ORAL | Status: DC | PRN

## 2014-03-13 MED ORDER — LIDOCAINE HCL (CARDIAC) 20 MG/ML IV SOLN
INTRAVENOUS | Status: DC | PRN
  Administered 2014-03-13: 30 mg via INTRAVENOUS

## 2014-03-13 MED ORDER — OXYCODONE HCL 5 MG PO TABS: 5.0000 mg | ORAL_TABLET | Freq: Once | ORAL | Status: DC | PRN

## 2014-03-13 MED ORDER — ONDANSETRON HCL 4 MG PO TABS: 4.0000 mg | ORAL_TABLET | Freq: Three times a day (TID) | ORAL | Status: DC | PRN

## 2014-03-13 MED ORDER — MIDAZOLAM HCL 5 MG/5ML IJ SOLN
INTRAMUSCULAR | Status: DC | PRN
  Administered 2014-03-13: 1 mg via INTRAVENOUS

## 2014-03-13 MED ORDER — SODIUM CHLORIDE 0.9 % IR SOLN
Status: DC | PRN
  Administered 2014-03-13: 21000 mL

## 2014-03-13 SURGICAL SUPPLY — 70 items
ANCHOR PEEK 4.75X19.1 SWLK C (Anchor) ×3 IMPLANT
ANCHOR SUT BIOCOMP LK 2.9X12.5 (Anchor) ×6 IMPLANT
BENZOIN TINCTURE PRP APPL 2/3 (GAUZE/BANDAGES/DRESSINGS) ×3 IMPLANT
BLADE 11 SAFETY STRL DISP (BLADE) IMPLANT
BLADE CUTTER GATOR 3.5 (BLADE) ×3 IMPLANT
BLADE GREAT WHITE 4.2 (BLADE) IMPLANT
BLADE GREAT WHITE 4.2MM (BLADE)
BLADE SURG 15 STRL LF DISP TIS (BLADE) IMPLANT
BLADE SURG 15 STRL SS (BLADE)
BLADE VORTEX 6.0 (BLADE) IMPLANT
BUR OVAL 4.0 (BURR) IMPLANT
BUR OVAL 6.0 (BURR) ×3 IMPLANT
CANNULA 5.75X71 LONG (CANNULA) ×3 IMPLANT
CANNULA TWIST IN 8.25X7CM (CANNULA) ×3 IMPLANT
CANNULA TWIST IN 8.25X9CM (CANNULA) IMPLANT
CLOSURE STERI-STRIP 1/2X4 (GAUZE/BANDAGES/DRESSINGS) ×1
CLSR STERI-STRIP ANTIMIC 1/2X4 (GAUZE/BANDAGES/DRESSINGS) ×2 IMPLANT
DECANTER SPIKE VIAL GLASS SM (MISCELLANEOUS) IMPLANT
DRAPE INCISE IOBAN 66X45 STRL (DRAPES) ×3 IMPLANT
DRAPE SHOULDER BEACH CHAIR (DRAPES) ×3 IMPLANT
DRAPE U 20/CS (DRAPES) ×3 IMPLANT
DRAPE U-SHAPE 47X51 STRL (DRAPES) ×3 IMPLANT
DRSG PAD ABDOMINAL 8X10 ST (GAUZE/BANDAGES/DRESSINGS) ×3 IMPLANT
DURAPREP 26ML APPLICATOR (WOUND CARE) ×3 IMPLANT
ELECT REM PT RETURN 9FT ADLT (ELECTROSURGICAL) ×3
ELECTRODE REM PT RTRN 9FT ADLT (ELECTROSURGICAL) ×1 IMPLANT
FIBERSTICK 2 (SUTURE) IMPLANT
GAUZE SPONGE 4X4 12PLY STRL (GAUZE/BANDAGES/DRESSINGS) ×3 IMPLANT
GLOVE BIO SURGEON STRL SZ8 (GLOVE) ×3 IMPLANT
GLOVE BIOGEL PI IND STRL 6.5 (GLOVE) ×2 IMPLANT
GLOVE BIOGEL PI IND STRL 8 (GLOVE) ×2 IMPLANT
GLOVE BIOGEL PI INDICATOR 6.5 (GLOVE) ×4
GLOVE BIOGEL PI INDICATOR 8 (GLOVE) ×4
GLOVE ECLIPSE 6.5 STRL STRAW (GLOVE) ×6 IMPLANT
GLOVE ORTHO TXT STRL SZ7.5 (GLOVE) ×3 IMPLANT
GOWN STRL REUS W/ TWL LRG LVL3 (GOWN DISPOSABLE) ×3 IMPLANT
GOWN STRL REUS W/ TWL XL LVL3 (GOWN DISPOSABLE) ×2 IMPLANT
GOWN STRL REUS W/TWL LRG LVL3 (GOWN DISPOSABLE) ×6
GOWN STRL REUS W/TWL XL LVL3 (GOWN DISPOSABLE) ×4
IMMOBILIZER SHOULDER FOAM XLGE (SOFTGOODS) IMPLANT
IV NS IRRIG 3000ML ARTHROMATIC (IV SOLUTION) ×6 IMPLANT
KIT PUSHLOCK 2.9 HIP (KITS) ×3 IMPLANT
KIT SHOULDER TRACTION (DRAPES) ×3 IMPLANT
LASSO CRESCENT QUICKPASS (SUTURE) ×3 IMPLANT
MANIFOLD NEPTUNE II (INSTRUMENTS) ×3 IMPLANT
NEEDLE SCORPION MULTI FIRE (NEEDLE) ×3 IMPLANT
PACK ARTHROSCOPY DSU (CUSTOM PROCEDURE TRAY) ×3 IMPLANT
PACK BASIN DAY SURGERY FS (CUSTOM PROCEDURE TRAY) ×3 IMPLANT
SET ARTHROSCOPY TUBING (MISCELLANEOUS) ×2
SET ARTHROSCOPY TUBING LN (MISCELLANEOUS) ×1 IMPLANT
SHEET MEDIUM DRAPE 40X70 STRL (DRAPES) ×3 IMPLANT
SLEEVE SCD COMPRESS KNEE MED (MISCELLANEOUS) ×3 IMPLANT
SLING ARM IMMOBILIZER LRG (SOFTGOODS) ×3 IMPLANT
SLING ARM IMMOBILIZER MED (SOFTGOODS) IMPLANT
SLING ARM LRG ADULT FOAM STRAP (SOFTGOODS) ×3 IMPLANT
SLING ARM MED ADULT FOAM STRAP (SOFTGOODS) IMPLANT
SLING ARM XL FOAM STRAP (SOFTGOODS) IMPLANT
SUT FIBERWIRE #2 38 T-5 BLUE (SUTURE) ×3
SUT MNCRL AB 4-0 PS2 18 (SUTURE) ×3 IMPLANT
SUT PDS AB 1 CT  36 (SUTURE)
SUT PDS AB 1 CT 36 (SUTURE) IMPLANT
SUT TIGER TAPE 7 IN WHITE (SUTURE) IMPLANT
SUT VIC AB 3-0 SH 27 (SUTURE)
SUT VIC AB 3-0 SH 27X BRD (SUTURE) IMPLANT
SUTURE FIBERWR #2 38 T-5 BLUE (SUTURE) ×1 IMPLANT
TAPE FIBER 2MM 7IN #2 BLUE (SUTURE) ×6 IMPLANT
TAPE SUT LABRALTAP WHT/BLK (SUTURE) ×6 IMPLANT
TOWEL OR 17X24 6PK STRL BLUE (TOWEL DISPOSABLE) ×3 IMPLANT
TOWEL OR NON WOVEN STRL DISP B (DISPOSABLE) ×6 IMPLANT
WATER STERILE IRR 1000ML POUR (IV SOLUTION) ×3 IMPLANT

## 2014-03-13 NOTE — Transfer of Care (Signed)
Immediate Anesthesia Transfer of Care Note  Patient: Jeff Shaw  Procedure(s) Performed: Procedure(s): RIGHT SHOULDER ARTHROSCOPY DEBRIDEMENT,BANKART,ACROMIOPLASTY,ROTATOR CUFF REPAIR (Right)  Patient Location: PACU  Anesthesia Type:GA combined with regional for post-op pain  Level of Consciousness: awake, alert , oriented and patient cooperative  Airway & Oxygen Therapy: Patient Spontanous Breathing and Patient connected to face mask oxygen  Post-op Assessment: Report given to PACU RN and Post -op Vital signs reviewed and stable  Post vital signs: Reviewed and stable  Complications: No apparent anesthesia complications

## 2014-03-13 NOTE — Anesthesia Postprocedure Evaluation (Signed)
  Anesthesia Post-op Note  Patient: Jeff Shaw  Procedure(s) Performed: Procedure(s): RIGHT SHOULDER ARTHROSCOPY DEBRIDEMENT,BANKART,ACROMIOPLASTY,ROTATOR CUFF REPAIR (Right)  Patient Location: PACU  Anesthesia Type: General, Regional   Level of Consciousness: awake, alert  and oriented  Airway and Oxygen Therapy: Patient Spontanous Breathing  Post-op Pain: none  Post-op Assessment: Post-op Vital signs reviewed  Post-op Vital Signs: Reviewed  Last Vitals:  Filed Vitals:   03/13/14 1515  BP: 124/73  Pulse: 69  Temp:   Resp: 16    Complications: No apparent anesthesia complications

## 2014-03-13 NOTE — Op Note (Signed)
03/13/2014  2:27 PM  PATIENT:  Jeff Shaw    PRE-OPERATIVE DIAGNOSIS:  Right shoulder recurrent dislocation, infraspinatus tear, impingement syndrome, history of rotator cuff repair  POST-OPERATIVE DIAGNOSIS:  Same  PROCEDURE:  Right shoulder arthroscopy with Bankart capsulorrhaphy and arthroscopic rotator cuff repair, revision, with acromioplasty and extensive debridement  SURGEON:  Eulas PostLANDAU,Jazon Jipson P, MD  PHYSICIAN ASSISTANT: Janace LittenBrandon Parry, OPA-C, present and scrubbed throughout the case, critical for completion in a timely fashion, and for retraction, instrumentation, and closure.  ANESTHESIA:   General  PREOPERATIVE INDICATIONS:  Jeff Shaw is a  58 y.o. male who had a previous right shoulder arthroscopy with open rotator cuff repair performed in 2005. In 2011 he fell and dislocated both shoulders in fact, although the right shoulder has had multiple recurrent dislocations since that time, total of 4 times.  Preoperative MRI demonstrated absent biceps tendon, large retracted rotator cuff tear involving the supraspinatus posteriorly, and a large portion of the infraspinatus. He elected for revision surgical intervention.  The risks benefits and alternatives were discussed with the patient preoperatively including but not limited to the risks of infection, bleeding, nerve injury, cardiopulmonary complications, the need for revision surgery, among others, and the patient was willing to proceed.  OPERATIVE IMPLANTS: Arthrex bio composite 2.9 mm push lock 2 for the glenoid and anterior capsule, with a simple FiberWire margin convergence stitch medially for the infraspinatus and supraspinatus, with a fiber tape laterally through the supraspinatus and infraspinatus placed into a PEEK swivel lock.  OPERATIVE FINDINGS: the shoulder was unstable anteriorly, and I could easily dislocate him. The subscapularis was intact and biceps tendon was absent. The glenohumeral articular cartilage was in  reasonably good condition. The supraspinatus had intact fibers anteriorly, but posteriorly had a large retracted tear of the infraspinatus. This extended up to the substance of the supraspinatus although there were still intact anterior fibers. There was moderate subacromial spurring, and overall tissue quality was poor. It was however reasonably mobile.  OPERATIVE PROCEDURE: the patient was brought to the operating room and placed in the supine position. Gen. Anesthesia was administered. IV antibiotics were given. Right upper time he was examined and found to be unstable anteriorly. He was then placed in a semilateral decubitus position and all bony prominences padded. The right upper extremity was prepped and draped in usual sterile fashion. Time out was performed. Diagnostic arthroscopy was carried out the above-named findings. I used the arthroscopic shaver to debride portions of the labrum as well as some chondral debris, and he had a fairly large Hill-Sachs lesion as well. The anterior glenoid face had maybe 15% missing bone, and the labrum was somewhat deficient anteriorly. The capsule was also fairly reflected off of the anterior face, and attached further back on the neck. Given this finding, I performed an anterior capsulorrhaphy. I did not really mobilize the labrum is much is just simply get capsular bites of tissue, and then repair this up onto the face. This provided excellent imbrication of the capsule and restoration of anterior soft tissue tension for the humeral head.  I then went to the subacromial space. I used the arthroscopic shaver to debride the infraspinatus tissue back to as stable configuration as possible. The tissue quality was extremely poor. Nonetheless I did get adequate tissue margins, and then used a bird beak suture passer from posteriorly to pass a horizontal mattress suture through the medial side of the V between the tendons, which was effectively a margin convergence  stitch. I used a  scorpion to pass the suture anteriorly.  I then tied this arthroscopically and had excellent tension, and then used a bird beak posteriorly for the infraspinatus laterally, and a scorpion anteriorly. I then took these fiber tape suture down into a anchor and effectively restored the tension on the rotator cuff reattaching this to the head. Excellent overall fixation was achieved and the rotator cuff now moved as a single unit.  I did perform a light acromioplasty, with CA ligament release, and I used a 6-0 bur. I debrided the bursal tissue, and poor quality cuff tissue, and then remove the arthroscopic instruments and repaired the portals with Monocryl followed by Steri-Strips and sterile gauze. He was awakened and returned to the PACU in stable and satisfactory condition. There were no complications and he tolerated the procedure well.

## 2014-03-13 NOTE — H&P (Signed)
PREOPERATIVE H&Shaw  Chief Complaint: Right shoulder pain, instability  HPI: Jeff Shaw is a 58 y.o. male Who had a past history of a right shoulder rotator cuff repair performed in 2005. In 2011 he fell and dislocated both of his shoulders. His right side is now dislocated a total of 4 times. He's had chronic severe pain, with instability. He is also had persistent apprehension, and difficulty functioning. He wakes up at nighttime, with pain located laterally, anteriorly, and he normally drives a bus. His last dislocation was 12/31/2013.  Past Medical History  Diagnosis Date  . Hypertension   . High cholesterol   . Gout   . Depression    Past Surgical History  Procedure Laterality Date  . Rotator cuff repair  2005    right shoulder  . Colonoscopy     History   Social History  . Marital Status: Married    Spouse Name: N/A    Number of Children: N/A  . Years of Education: N/A   Social History Main Topics  . Smoking status: Never Smoker   . Smokeless tobacco: None  . Alcohol Use: Yes     Comment: occassionally   . Drug Use: No  . Sexual Activity: None   Other Topics Concern  . None   Social History Narrative   History reviewed. No pertinent family history. Allergies  Allergen Reactions  . Lisinopril Swelling   Prior to Admission medications   Medication Sig Start Date End Date Taking? Authorizing Provider  metoprolol succinate (TOPROL-XL) 50 MG 24 hr tablet Take 50 mg by mouth daily. Take with or immediately following a meal.   Yes Historical Provider, MD  citalopram (CELEXA) 20 MG tablet Take 20 mg by mouth daily.    Historical Provider, MD  HYDROcodone-acetaminophen (NORCO/VICODIN) 5-325 MG per tablet Take 1 tablet by mouth every 4 (four) hours as needed. 01/26/14   Vickki HearingStanley E Harrison, MD  ibuprofen (ADVIL,MOTRIN) 200 MG tablet Take 600 mg by mouth every 6 (six) hours as needed. pain    Historical Provider, MD  simvastatin (ZOCOR) 40 MG tablet Take 40 mg by mouth  daily.    Historical Provider, MD     Positive ROS: All other systems have been reviewed and were otherwise negative with the exception of those mentioned in the HPI and as above.  Physical Exam: General: Alert, no acute distress Cardiovascular: No pedal edema Respiratory: No cyanosis, no use of accessory musculature GI: No organomegaly, abdomen is soft and non-tender Skin: No lesions in the area of chief complaint Neurologic: Sensation intact distally Psychiatric: Patient is competent for consent with normal mood and affect Lymphatic: No axillary or cervical lymphadenopathy  MUSCULOSKELETAL: Right shoulder active motion is 0-170 with positive apprehension, weakness with supraspinatus testing, moderately positive drop arm sign. No pain over the before meals joint.  Assessment: Right shoulder recurrent dislocation, with large if not massive retracted rotator cuff tear  Plan: Plan for Procedure(s): RIGHT SHOULDER ARTHROSCOPY DEBRIDEMENT,BANKART,ACROMIOPLASTY,ROTATOR CUFF REPAIR  The risks benefits and alternatives were discussed with the patient including but not limited to the risks of nonoperative treatment, versus surgical intervention including infection, bleeding, nerve injury,  blood clots, cardiopulmonary complications, morbidity, mortality, among others, and they were willing to proceed. We have also discussed the risks that this tear may be irreparable, in which case hopefully we can get at least a partial repair to confer some level of stability to his shoulder. This is an extremely difficult problem, with a revision surgery, and we  will do our best to achieve a functional outcome.  Jeff Shaw,Jeff Boshers P, MD Cell [phone]   03/13/2014 7:24 AM

## 2014-03-13 NOTE — Anesthesia Preprocedure Evaluation (Addendum)
Anesthesia Evaluation  Patient identified by MRN, date of birth, ID band Patient awake    Reviewed: Allergy & Precautions, H&P , NPO status , Patient's Chart, lab work & pertinent test results, reviewed documented beta blocker date and time   Airway Mallampati: II  TM Distance: >3 FB Neck ROM: Full    Dental no notable dental hx. (+) Teeth Intact, Dental Advisory Given   Pulmonary neg pulmonary ROS,  breath sounds clear to auscultation  Pulmonary exam normal       Cardiovascular hypertension, Pt. on home beta blockers negative cardio ROS  Rhythm:Regular Rate:Normal     Neuro/Psych Depression negative neurological ROS     GI/Hepatic negative GI ROS, Neg liver ROS,   Endo/Other  negative endocrine ROS  Renal/GU negative Renal ROS  negative genitourinary   Musculoskeletal   Abdominal   Peds  Hematology negative hematology ROS (+)   Anesthesia Other Findings   Reproductive/Obstetrics negative OB ROS                           Anesthesia Physical Anesthesia Plan  ASA: II  Anesthesia Plan: General and Regional   Post-op Pain Management:    Induction: Intravenous  Airway Management Planned: Oral ETT  Additional Equipment:   Intra-op Plan:   Post-operative Plan: Extubation in OR  Informed Consent: I have reviewed the patients History and Physical, chart, labs and discussed the procedure including the risks, benefits and alternatives for the proposed anesthesia with the patient or authorized representative who has indicated his/her understanding and acceptance.   Dental advisory given  Plan Discussed with: CRNA  Anesthesia Plan Comments:         Anesthesia Quick Evaluation

## 2014-03-13 NOTE — Progress Notes (Signed)
Assisted Dr. Edmond Fitzgerald with right, ultrasound guided, interscalene  block. Side rails up, monitors on throughout procedure. See vital signs in flow sheet. Tolerated Procedure well. 

## 2014-03-13 NOTE — Anesthesia Procedure Notes (Addendum)
Anesthesia Regional Block:  Interscalene brachial plexus block  Pre-Anesthetic Checklist: ,, timeout performed, Correct Patient, Correct Site, Correct Laterality, Correct Procedure, Correct Position, site marked, Risks and benefits discussed, pre-op evaluation,  At surgeon's request and post-op pain management  Laterality: Right  Prep: Maximum Sterile Barrier Precautions used and chloraprep       Needles:  Injection technique: Single-shot  Needle Type: Echogenic Stimulator Needle     Needle Length: 5cm 5 cm Needle Gauge: 22 and 22 G    Additional Needles:  Procedures: ultrasound guided (picture in chart) and nerve stimulator Interscalene brachial plexus block  Nerve Stimulator or Paresthesia:  Response: Biceps response,   Additional Responses:   Narrative:  Start time: 03/13/2014 11:27 AM End time: 03/13/2014 11:36 AM Injection made incrementally with aspirations every 5 mL.  Additional Notes: 2% Lidocaine skin wheel.

## 2014-03-13 NOTE — Discharge Instructions (Signed)
Diet: As you were doing prior to hospitalization   Shower:  May shower but keep the wounds dry, use an occlusive plastic wrap, NO SOAKING IN TUB.  If the bandage gets wet, change with a clean dry gauze.  Dressing:  You may change your dressing 3-5 days after surgery.  Then change the dressing daily with sterile gauze dressing.    There are sticky tapes (steri-strips) on your wounds and all the stitches are absorbable.  Leave the steri-strips in place when changing your dressings, they will peel off with time, usually 2-3 weeks.  Activity:  Increase activity slowly as tolerated, but follow the weight bearing instructions below.  No lifting or driving for 6 weeks.  Weight Bearing:   Sling at all times, no lifting with arm..    To prevent constipation: you may use a stool softener such as -  Colace (over the counter) 100 mg by mouth twice a day  Drink plenty of fluids (prune juice may be helpful) and high fiber foods Miralax (over the counter) for constipation as needed.    Itching:  If you experience itching with your medications, try taking only a single pain pill, or even half a pain pill at a time.  You may take up to 10 pain pills per day, and you can also use benadryl over the counter for itching or also to help with sleep.   Precautions:  If you experience chest pain or shortness of breath - call 911 immediately for transfer to the hospital emergency department!!  If you develop a fever greater that 101 F, purulent drainage from wound, increased redness or drainage from wound, or calf pain -- Call the office at [phone]                                                Follow- Up Appointment:  Please call for an appointment to be [place] in 2 [place] [phone]   Regional Anesthesia Blocks  1. Numbness or the inability to move the "blocked" extremity may last from 3-48 hours after placement. The length of time depends on the medication injected and your individual response  to the medication. If the numbness is not going away after 48 hours, call your surgeon.  2. The extremity that is blocked will need to be protected until the numbness is gone and the  Strength has returned. Because you cannot feel it, you will need to take extra care to avoid injury. Because it may be weak, you may have difficulty moving it or using it. You may not know what position it is in without looking at it while the block is in effect.  3. For blocks in the legs and feet, returning to weight bearing and walking needs to be done carefully. You will need to wait until the numbness is entirely gone and the strength has returned. You should be able to move your leg and foot normally before you try and bear weight or walk. You will need someone to be with you when you first try to ensure you do not fall and possibly risk injury.  4. Bruising and tenderness at the needle site are common side effects and will resolve in a few days.  5. Persistent numbness or new problems with movement should be communicated to the surgeon or the [facility] [phone])/  [facility] [phone]).    Post Anesthesia Home Care Instructions  Activity: Get plenty of rest for the remainder of the day. A responsible adult should stay with you for 24 hours following the procedure.  For the next 24 hours, DO NOT: -Drive a car -Advertising copywriterperate machinery -Drink alcoholic beverages -Take any medication unless instructed by your physician -Make any legal decisions or sign important papers.  Meals: Start with liquid foods such as gelatin or soup. Progress to regular foods as tolerated. Avoid greasy, spicy, heavy foods. If nausea and/or vomiting occur, drink only clear liquids until the nausea and/or vomiting subsides. Call your physician if vomiting continues.  Special Instructions/Symptoms: Your throat may feel dry or sore from the anesthesia or the breathing tube placed in your throat during  surgery. If this causes discomfort, gargle with warm salt water. The discomfort should disappear within 24 hours.

## 2014-03-16 ENCOUNTER — Encounter (HOSPITAL_BASED_OUTPATIENT_CLINIC_OR_DEPARTMENT_OTHER): Payer: Self-pay | Admitting: Orthopedic Surgery

## 2014-03-17 ENCOUNTER — Encounter (HOSPITAL_BASED_OUTPATIENT_CLINIC_OR_DEPARTMENT_OTHER): Payer: Self-pay | Admitting: Orthopedic Surgery

## 2014-09-26 ENCOUNTER — Encounter (HOSPITAL_COMMUNITY): Payer: Self-pay | Admitting: Emergency Medicine

## 2014-09-26 ENCOUNTER — Emergency Department (HOSPITAL_COMMUNITY)
Admission: EM | Admit: 2014-09-26 | Discharge: 2014-09-26 | Disposition: A | Discharge status: Home / Self Care | Payer: BLUE CROSS/BLUE SHIELD | Attending: Emergency Medicine | Admitting: Emergency Medicine

## 2014-09-26 DIAGNOSIS — I10 Essential (primary) hypertension: Secondary | ICD-10-CM | POA: Insufficient documentation

## 2014-09-26 DIAGNOSIS — R21 Rash and other nonspecific skin eruption: Secondary | ICD-10-CM | POA: Diagnosis present

## 2014-09-26 DIAGNOSIS — E780 Pure hypercholesterolemia: Secondary | ICD-10-CM | POA: Insufficient documentation

## 2014-09-26 DIAGNOSIS — Z79899 Other long term (current) drug therapy: Secondary | ICD-10-CM | POA: Insufficient documentation

## 2014-09-26 DIAGNOSIS — Z8739 Personal history of other diseases of the musculoskeletal system and connective tissue: Secondary | ICD-10-CM | POA: Insufficient documentation

## 2014-09-26 DIAGNOSIS — L509 Urticaria, unspecified: Secondary | ICD-10-CM | POA: Diagnosis not present

## 2014-09-26 DIAGNOSIS — F329 Major depressive disorder, single episode, unspecified: Secondary | ICD-10-CM | POA: Diagnosis not present

## 2014-09-26 MED ORDER — DIPHENHYDRAMINE HCL 50 MG/ML IJ SOLN
25.0000 mg | Freq: Once | INTRAMUSCULAR | Status: AC
  Administered 2014-09-26: 25 mg via INTRAVENOUS
  Filled 2014-09-26: qty 1

## 2014-09-26 MED ORDER — EPINEPHRINE 0.3 MG/0.3ML IJ SOAJ: 0.3000 mg | Freq: Once | INTRAMUSCULAR | Status: DC

## 2014-09-26 NOTE — ED Notes (Addendum)
Patient reports that he has been working outside today and when he started to take shower he started itching. Patient noticed hives on abd mostly in folds of skin. Patient also reports feeling like tongue is swelling and "some swelling in back of throat."  Denies any difficulty with swallowing or breathing. Patient able to handle oral secretions. Patient denies any new food, detergent, soaps, etc. Patient unsure if he came into contact with poison oak, ivy, or sumac.

## 2014-09-26 NOTE — ED Provider Notes (Signed)
CSN: [id number]     Arrival date & time 09/26/14  1828 History   First MD Initiated Contact with Patient 09/26/14 1851     Chief Complaint  Patient presents with  . Urticaria     (Consider location/radiation/quality/duration/timing/severity/associated sxs/prior Treatment) HPI   59ym with rash. Onset shortly before arrival. Was outside working. Came in and began to feeling itchy. Took a shower but symptoms only worsened. Red rash. "keeps moving." No respiratory or gi complaints. No contacts with similar symptoms. No obvious trigger.   Past Medical History  Diagnosis Date  . Hypertension   . High cholesterol   . Gout   . Depression   . Dislocation of right shoulder joint 03/13/2014   Past Surgical History  Procedure Laterality Date  . Rotator cuff repair  2005    right shoulder  . Colonoscopy    . Shoulder arthroscopy with bankart repair Right 03/13/2014    Procedure: RIGHT SHOULDER ARTHROSCOPY WITH DEBRIDEMENT AND BANKART REPAIR;  Surgeon: Eulas Post, MD;  Location: [facility];  Service: Orthopedics;  Laterality: Right;  . Shoulder arthroscopy with rotator cuff repair and subacromial decompression Right 03/13/2014    Procedure: ARTHROSCOPIC ROTATOR CUFF REPAIR AND SUBACROMIAL DECOMPRESSION;  Surgeon: Eulas Post, MD;  Location: [facility];  Service: Orthopedics;  Laterality: Right;   History reviewed. No pertinent family history. History  Substance Use Topics  . Smoking status: Never Smoker   . Smokeless tobacco: Never Used  . Alcohol Use: Yes     Comment: occassionally     Review of Systems  All systems reviewed and negative, other than as noted in HPI.   Allergies  Lisinopril  Home Medications   Prior to Admission medications   Medication Sig Start Date End Date Taking? Authorizing Provider  busPIRone (BUSPAR) 15 MG tablet Take 15 mg by mouth daily. 08/12/14  Yes Historical Provider, MD  citalopram (CELEXA) 20 MG tablet Take  20 mg by mouth daily.   Yes Historical Provider, MD  diphenhydrAMINE (BENADRYL) 25 MG tablet Take 50 mg by mouth every 6 (six) hours as needed for itching.   Yes Historical Provider, MD  metoprolol succinate (TOPROL-XL) 50 MG 24 hr tablet Take 50 mg by mouth daily. Take with or immediately following a meal.   Yes Historical Provider, MD  simvastatin (ZOCOR) 40 MG tablet Take 40 mg by mouth daily.   Yes Historical Provider, MD  baclofen (LIORESAL) 10 MG tablet Take 1 tablet (10 mg total) by mouth 3 (three) times daily. As needed for muscle spasm 03/13/14   Teryl Lucy, MD  ondansetron (ZOFRAN) 4 MG tablet Take 1 tablet (4 mg total) by mouth every 8 (eight) hours as needed for nausea or vomiting. 03/13/14   Teryl Lucy, MD  oxyCODONE-acetaminophen (PERCOCET) 10-325 MG per tablet Take 1-2 tablets by mouth every 6 (six) hours as needed for pain. MAXIMUM TOTAL ACETAMINOPHEN DOSE IS 4000 MG PER DAY 03/13/14   Teryl Lucy, MD  sennosides-docusate sodium (SENOKOT-S) 8.6-50 MG tablet Take 2 tablets by mouth daily. 03/13/14   Teryl Lucy, MD   BP 149/93 mmHg  Pulse 73  Temp(Src) 98 F (36.7 C) (Oral)  Resp 16  Ht  (1.778 m)  Wt 187 lb (84.823 kg)  BMI 26.83 kg/m2  SpO2 97% Physical Exam  Constitutional: He appears well-developed and well-nourished. No distress.  HENT:  Head: Normocephalic and atraumatic.  Eyes: Conjunctivae are normal. Right eye exhibits no discharge. Left eye exhibits no  discharge.  Neck: Neck supple.  Cardiovascular: Normal rate, regular rhythm and normal heart sounds.  Exam reveals no gallop and no friction rub.   No murmur heard. Pulmonary/Chest: Effort normal and breath sounds normal. No respiratory distress.  Abdominal: Soft. He exhibits no distension. There is no tenderness.  Musculoskeletal: He exhibits no edema or tenderness.  Neurological: He is alert.  Skin: Skin is warm and dry. Rash noted.  Generalized urticarial rash.   Psychiatric: He has a normal  mood and affect. His behavior is normal. Thought content normal.  Nursing note and vitals reviewed.   ED Course  Procedures (including critical care time) Labs Review Labs Reviewed - No data to display  Imaging Review No results found.   EKG Interpretation None      MDM   Final diagnoses:  Urticaria    59yM with urticaria. Unclear trigger. No respiratory complaints, GI complaints or HD instability. No progression of symptoms during time in ED. Would treat symptoms PRN with antihistamines at this point. Return precautions discussed.     Raeford RazorStephen Bartley Vuolo, MD 10/08/14 1400

## 2014-09-26 NOTE — ED Notes (Signed)
Discharge instructions given, pt demonstrated teach back and verbal understanding. No concerns voiced.  

## 2014-09-26 NOTE — Discharge Instructions (Signed)
Hives Hives are itchy, red, swollen areas of the skin. They can vary in size and location on your body. Hives can come and go for hours or several days (acute hives) or for several weeks (chronic hives). Hives do not spread from person to person (noncontagious). They may get worse with scratching, exercise, and emotional stress. CAUSES   Allergic reaction to food, additives, or drugs.  Infections, including the common cold.  Illness, such as vasculitis, lupus, or thyroid disease.  Exposure to sunlight, heat, or cold.  Exercise.  Stress.  Contact with chemicals. SYMPTOMS   Red or white swollen patches on the skin. The patches may change size, shape, and location quickly and repeatedly.  Itching.  Swelling of the hands, feet, and face. This may occur if hives develop deeper in the skin. DIAGNOSIS  Your caregiver can usually tell what is wrong by performing a physical exam. Skin or blood tests may also be done to determine the cause of your hives. In some cases, the cause cannot be determined. TREATMENT  Mild cases usually get better with medicines such as antihistamines. Severe cases may require an emergency epinephrine injection. If the cause of your hives is known, treatment includes avoiding that trigger.  HOME CARE INSTRUCTIONS   Avoid causes that trigger your hives.  Take antihistamines as directed by your caregiver to reduce the severity of your hives. Non-sedating or low-sedating antihistamines are usually recommended. Do not drive while taking an antihistamine.  Take any other medicines prescribed for itching as directed by your caregiver.  Wear loose-fitting clothing.  Keep all follow-up appointments as directed by your caregiver. SEEK MEDICAL CARE IF:   You have persistent or severe itching that is not relieved with medicine.  You have painful or swollen joints. SEEK IMMEDIATE MEDICAL CARE IF:   You have a fever.  Your tongue or lips are swollen.  You have  trouble breathing or swallowing.  You feel tightness in the throat or chest.  You have abdominal pain. These problems may be the first sign of a life-threatening allergic reaction. Call your local emergency services (911 in [place] MAKE SURE YOU:   Understand these instructions.  Will watch your condition.  Will get help right away if you are not doing well or get worse. Document Released: 04/17/2005 Document Revised: 04/22/2013 Document Reviewed: 07/11/2011 ExitCare Patient Information 2015 [employer]. This information is not intended to replace advice given to you by your health care provider. Make sure you discuss any questions you have with your health care provider.  

## 2014-09-26 NOTE — ED Notes (Signed)
Hives diffusely noted to body. Patient states "funny feeling in throat" No respiratory distress. Speaks in full sentences.

## 2017-05-25 ENCOUNTER — Encounter (HOSPITAL_COMMUNITY): Payer: Self-pay | Admitting: *Deleted

## 2017-05-25 ENCOUNTER — Emergency Department (HOSPITAL_COMMUNITY)
Admission: EM | Admit: 2017-05-25 | Discharge: 2017-05-25 | Disposition: A | Discharge status: Home / Self Care | Payer: BLUE CROSS/BLUE SHIELD | Attending: Emergency Medicine | Admitting: Emergency Medicine

## 2017-05-25 DIAGNOSIS — Z79899 Other long term (current) drug therapy: Secondary | ICD-10-CM | POA: Insufficient documentation

## 2017-05-25 DIAGNOSIS — R600 Localized edema: Secondary | ICD-10-CM | POA: Insufficient documentation

## 2017-05-25 DIAGNOSIS — T7840XA Allergy, unspecified, initial encounter: Secondary | ICD-10-CM | POA: Insufficient documentation

## 2017-05-25 DIAGNOSIS — I10 Essential (primary) hypertension: Secondary | ICD-10-CM | POA: Insufficient documentation

## 2017-05-25 DIAGNOSIS — R1310 Dysphagia, unspecified: Secondary | ICD-10-CM | POA: Insufficient documentation

## 2017-05-25 MED ORDER — FAMOTIDINE IN NACL 20-0.9 MG/50ML-% IV SOLN
INTRAVENOUS | Status: AC
  Administered 2017-05-25: 20 mg via INTRAVENOUS
  Filled 2017-05-25: qty 50

## 2017-05-25 MED ORDER — PREDNISONE 10 MG PO TABS: 40.0000 mg | ORAL_TABLET | Freq: Every day | ORAL | 0 refills | Status: DC

## 2017-05-25 MED ORDER — DIPHENHYDRAMINE HCL 25 MG PO TABS: 25.0000 mg | ORAL_TABLET | Freq: Four times a day (QID) | ORAL | 0 refills | Status: DC

## 2017-05-25 MED ORDER — METHYLPREDNISOLONE SODIUM SUCC 125 MG IJ SOLR
125.0000 mg | Freq: Once | INTRAMUSCULAR | Status: AC
  Administered 2017-05-25: 125 mg via INTRAVENOUS

## 2017-05-25 MED ORDER — METHYLPREDNISOLONE SODIUM SUCC 125 MG IJ SOLR
INTRAMUSCULAR | Status: AC
  Administered 2017-05-25: 125 mg via INTRAVENOUS
  Filled 2017-05-25: qty 2

## 2017-05-25 MED ORDER — FAMOTIDINE IN NACL 20-0.9 MG/50ML-% IV SOLN
20.0000 mg | Freq: Once | INTRAVENOUS | Status: AC
  Administered 2017-05-25: 20 mg via INTRAVENOUS

## 2017-05-25 MED ORDER — DIPHENHYDRAMINE HCL 50 MG/ML IJ SOLN
INTRAMUSCULAR | Status: AC
  Filled 2017-05-25: qty 1

## 2017-05-25 MED ORDER — FAMOTIDINE 20 MG PO TABS: 20.0000 mg | ORAL_TABLET | Freq: Two times a day (BID) | ORAL | 0 refills | Status: DC

## 2017-05-25 NOTE — ED Triage Notes (Addendum)
Pt states he was eating a frozen apple pie when he noticed his tongue and throat swelling along with a rash along his side. Pt states he has taken 100mg  benadryl at home about 1 hour ago.

## 2017-05-25 NOTE — Discharge Instructions (Signed)
Take the Benadryl every 6 hours 1 or 2 tablets for the next 24 hours at least.  Take the Pepcid twice a day for the next 7 days.  Take prednisone as directed for the next 5 days.  Sleep propped up tonight.  Would expect a significant improvement by morning as the steroids begin to work.  Return for any worse swelling of the tongue.  Or any difficulty breathing.  Any difficulty speaking.  Any difficulty swallowing your saliva.

## 2017-05-25 NOTE — ED Provider Notes (Signed)
[facility] EMERGENCY DEPARTMENT Provider Note   CSN: [id number] Arrival date & time: 05/25/17  1959     History   Chief Complaint Chief Complaint  Patient presents with  . Allergic Reaction    HPI Jeff Shaw is a 62 y.o. male.  Patient with an acute onset symptoms while eating ice cream and apple pie.  Patient noticed that he had swelling in the lower part of his throat under his chin.  Felt as if his tongue was swelling.  Developed a rash on his arms and trunk.  It is itchy in nature.  Patient took 50 mg of Benadryl orally at home right prior to coming in shortly after the reaction.  Patient had a similar reaction not to these food sources in the past.  At that time they felt it was perhaps related to his lisinopril.  Patient no longer taking lisinopril.  Has not had trouble eating ice cream or rubs.  Denies any wheezing.  Able to swallow secretions.  Patient able to talk.      Past Medical History:  Diagnosis Date  . Depression   . Dislocation of right shoulder joint 03/13/2014  . Gout   . High cholesterol   . Hypertension     Patient Active Problem List   Diagnosis Date Noted  . Dislocation of right shoulder joint 03/13/2014  . Complete tear of right rotator cuff 03/13/2014    Past Surgical History:  Procedure Laterality Date  . COLONOSCOPY    . ROTATOR CUFF REPAIR  2005   right shoulder  . SHOULDER ARTHROSCOPY WITH BANKART REPAIR Right 03/13/2014   Procedure: RIGHT SHOULDER ARTHROSCOPY WITH DEBRIDEMENT AND BANKART REPAIR;  Surgeon: Eulas Post, MD;  Location: [facility];  Service: Orthopedics;  Laterality: Right;  . SHOULDER ARTHROSCOPY WITH ROTATOR CUFF REPAIR AND SUBACROMIAL DECOMPRESSION Right 03/13/2014   Procedure: ARTHROSCOPIC ROTATOR CUFF REPAIR AND SUBACROMIAL DECOMPRESSION;  Surgeon: Eulas Post, MD;  Location: [facility];  Service: Orthopedics;  Laterality: Right;       Home Medications    Prior to Admission  medications   Medication Sig Start Date End Date Taking? Authorizing Provider  baclofen (LIORESAL) 10 MG tablet Take 1 tablet (10 mg total) by mouth 3 (three) times daily. As needed for muscle spasm 03/13/14   Teryl Lucy, MD  busPIRone (BUSPAR) 15 MG tablet Take 15 mg by mouth daily. 08/12/14   [provider]  citalopram (CELEXA) 20 MG tablet Take 20 mg by mouth daily.    [provider]  diphenhydrAMINE (BENADRYL) 25 MG tablet Take 50 mg by mouth every 6 (six) hours as needed for itching.    [provider]  EPINEPHrine (EPIPEN 2-PAK) 0.3 mg/0.3 mL IJ SOAJ injection Inject 0.3 mLs (0.3 mg total) into the muscle once. 09/26/14   Raeford Razor, MD  metoprolol succinate (TOPROL-XL) 50 MG 24 hr tablet Take 50 mg by mouth daily. Take with or immediately following a meal.    [provider]  ondansetron (ZOFRAN) 4 MG tablet Take 1 tablet (4 mg total) by mouth every 8 (eight) hours as needed for nausea or vomiting. 03/13/14   Teryl Lucy, MD  oxyCODONE-acetaminophen (PERCOCET) 10-325 MG per tablet Take 1-2 tablets by mouth every 6 (six) hours as needed for pain. MAXIMUM TOTAL ACETAMINOPHEN DOSE IS 4000 MG PER DAY 03/13/14   Teryl Lucy, MD  sennosides-docusate sodium (SENOKOT-S) 8.6-50 MG tablet Take 2 tablets by mouth daily. 03/13/14   Dion Saucier,  Ivin BootyJoshua, MD  simvastatin (ZOCOR) 40 MG tablet Take 40 mg by mouth daily.    [provider]    Family History No family history on file.  Social History Social History   Tobacco Use  . Smoking status: Never Smoker  . Smokeless tobacco: Never Used  Substance Use Topics  . Alcohol use: No    Frequency: Never    Comment: occassionally   . Drug use: No     Allergies   Lisinopril   Review of Systems Review of Systems  Constitutional: Negative for fever.  HENT: Positive for facial swelling and trouble swallowing. Negative for congestion and sore throat.   Eyes: Negative for visual disturbance.    Respiratory: Negative for shortness of breath and wheezing.   Cardiovascular: Negative for chest pain.  Gastrointestinal: Negative for abdominal pain.  Genitourinary: Negative for dysuria.  Musculoskeletal: Negative for back pain.  Skin: Positive for rash.  Neurological: Negative for syncope and headaches.  Hematological: Does not bruise/bleed easily.  Psychiatric/Behavioral: Negative for confusion.     Physical Exam Updated Vital Signs BP (!) 127/94   Pulse 79   Resp 14   Wt 84.8 kg (187 lb)   SpO2 99%   BMI 26.83 kg/m   Physical Exam  Constitutional: He is oriented to person, place, and time. He appears well-developed and well-nourished. No distress.  HENT:  Head: Normocephalic.  Mouth/Throat: No oropharyngeal exudate.  Patient was some slight swelling to his tongue.  No swelling to the floor of the mouth.  Has some submental swelling externally.  Patient able to swallow secretions.  Patient able to verbalize and talk fine.  Eyes: Conjunctivae and EOM are normal. Pupils are equal, round, and reactive to light.  Neck: Normal range of motion. Neck supple.  Cardiovascular: Normal rate, regular rhythm and normal heart sounds.  Pulmonary/Chest: Effort normal and breath sounds normal. No respiratory distress. He has no wheezes.  Abdominal: Soft. Bowel sounds are normal. There is no tenderness.  Musculoskeletal: Normal range of motion. He exhibits no edema.  Neurological: He is alert and oriented to person, place, and time. No cranial nerve deficit or sensory deficit. He exhibits normal muscle tone. Coordination normal.  Skin: Skin is warm. Rash noted.  Patient with hive-like rash to arms and trunk.  Does blanch.  No skin peeling no vesicles.  Itchy in nature.  Nursing note and vitals reviewed.    ED Treatments / Results  Labs (all labs ordered are listed, but only abnormal results are displayed) Labs Reviewed - No data to display  EKG  EKG Interpretation None        Radiology No results found.  Procedures Procedures (including critical care time)  Medications Ordered in ED Medications  famotidine (PEPCID) IVPB 20 mg premix (0 mg Intravenous Stopped 05/25/17 2102)  methylPREDNISolone sodium succinate (SOLU-MEDROL) 125 mg/2 mL injection 125 mg (125 mg Intravenous Given 05/25/17 2014)     Initial Impression / Assessment and Plan / ED Course  I have reviewed the triage vital signs and the nursing notes.  Pertinent labs & imaging results that were available during my care of the patient were reviewed by me and considered in my medical decision making (see chart for details).     Symptoms consistent with an allergic reaction.  Would not classify it as specific angioedema at this time.  Patient had taken Benadryl at home 50 mg total and got Pepcid here.  Some improvement with that.  Patient always also received Solu-Medrol  125 mg.  Patient had no wheezing.  Was able to handle secretions and was talking fine.  Rash was significant improvement.  Patient still speaking well.  Handling saliva able to swallow water.  Still has some swelling of the tongue.  No wheezing.  Stable for discharge home.  Final Clinical Impressions(s) / ED Diagnoses   Final diagnoses:  Allergic reaction, initial encounter    ED Discharge Orders    None       Vanetta Mulders, MD 05/25/17 2152

## 2017-05-25 NOTE — ED Notes (Signed)
Pt is up for discharge  Awaiting DC paperwork

## 2017-05-25 NOTE — ED Notes (Signed)
Pt ambulatory to waiting room. Pt verbalized understanding of discharge instructions.   

## 2018-07-17 ENCOUNTER — Emergency Department (HOSPITAL_COMMUNITY): Payer: Self-pay

## 2018-07-17 ENCOUNTER — Other Ambulatory Visit: Payer: Self-pay

## 2018-07-17 ENCOUNTER — Encounter (HOSPITAL_COMMUNITY): Payer: Self-pay | Admitting: Emergency Medicine

## 2018-07-17 ENCOUNTER — Emergency Department (HOSPITAL_COMMUNITY)
Admission: EM | Admit: 2018-07-17 | Discharge: 2018-07-18 | Disposition: A | Discharge status: Home / Self Care | Payer: Self-pay | Attending: Emergency Medicine | Admitting: Emergency Medicine

## 2018-07-17 DIAGNOSIS — M24411 Recurrent dislocation, right shoulder: Secondary | ICD-10-CM | POA: Insufficient documentation

## 2018-07-17 DIAGNOSIS — I10 Essential (primary) hypertension: Secondary | ICD-10-CM | POA: Insufficient documentation

## 2018-07-17 DIAGNOSIS — Y998 Other external cause status: Secondary | ICD-10-CM | POA: Insufficient documentation

## 2018-07-17 DIAGNOSIS — Y9384 Activity, sleeping: Secondary | ICD-10-CM | POA: Insufficient documentation

## 2018-07-17 DIAGNOSIS — Y92003 Bedroom of unspecified non-institutional (private) residence as the place of occurrence of the external cause: Secondary | ICD-10-CM | POA: Insufficient documentation

## 2018-07-17 DIAGNOSIS — W06XXXA Fall from bed, initial encounter: Secondary | ICD-10-CM | POA: Insufficient documentation

## 2018-07-17 MED ORDER — HYDROMORPHONE HCL 1 MG/ML IJ SOLN
1.0000 mg | Freq: Once | INTRAMUSCULAR | Status: AC
  Administered 2018-07-17: 1 mg via INTRAVENOUS
  Filled 2018-07-17: qty 1

## 2018-07-17 NOTE — ED Notes (Signed)
ED Provider at bedside. 

## 2018-07-17 NOTE — ED Triage Notes (Signed)
Pt shoulder dislocated while at home today. Pt had shoulder "put back in place earlier today."

## 2018-07-17 NOTE — ED Provider Notes (Signed)
Emergency Department Provider Note   I have reviewed the triage vital signs and the nursing notes.   HISTORY  Chief Complaint Shoulder Pain   HPI Jeff Shaw is a 63 y.o. male with a history of right shoulder dislocations in 2005 and 2015 that had a shoulder dislocation earlier today.  He went to a hospital in [place] where he took him all day to get it back in and he was discharged home.  He went home and went to sleep out his immobilizer and his arm fell off the bed and dislocated again approximate 30 minutes prior to arrival.  No other trauma no other pain.  He has had some type of arthroscopy of his shoulder along with a rotator cuff injury in the past.  His surgeon is Dr. Dion Saucier in [place]. No other associated or modifying symptoms.    Past Medical History:  Diagnosis Date  . Depression   . Dislocation of right shoulder joint 03/13/2014  . Gout   . High cholesterol   . Hypertension     Patient Active Problem List   Diagnosis Date Noted  . Dislocation of right shoulder joint 03/13/2014  . Complete tear of right rotator cuff 03/13/2014    Past Surgical History:  Procedure Laterality Date  . COLONOSCOPY    . ROTATOR CUFF REPAIR  2005   right shoulder  . SHOULDER ARTHROSCOPY WITH BANKART REPAIR Right 03/13/2014   Procedure: RIGHT SHOULDER ARTHROSCOPY WITH DEBRIDEMENT AND BANKART REPAIR;  Surgeon: Eulas Post, MD;  Location: [facility];  Service: Orthopedics;  Laterality: Right;  . SHOULDER ARTHROSCOPY WITH ROTATOR CUFF REPAIR AND SUBACROMIAL DECOMPRESSION Right 03/13/2014   Procedure: ARTHROSCOPIC ROTATOR CUFF REPAIR AND SUBACROMIAL DECOMPRESSION;  Surgeon: Eulas Post, MD;  Location: [facility];  Service: Orthopedics;  Laterality: Right;    Current Outpatient Rx  . Order #: [id number] Class: Normal  . Order #: [id number] Class: Historical Med  . Order #: [id number] Class: Historical Med  . Order #: [id number] Class: Historical Med   . Order #: [id number] Class: Print  . Order #: [id number] Class: Print  . Order #: [id number] Class: Print  . Order #: [id number] Class: Historical Med  . Order #: [id number] Class: Normal  . Order #: [id number] Class: Normal  . Order #: [id number] Class: Print  . Order #: [id number] Class: Normal  . Order #: [id number] Class: Historical Med    Allergies Lisinopril  No family history on file.  Social History Social History   Tobacco Use  . Smoking status: Never Smoker  . Smokeless tobacco: Never Used  Substance Use Topics  . Alcohol use: No    Frequency: Never    Comment: occassionally   . Drug use: No    Review of Systems  All other systems negative except as documented in the HPI. All pertinent positives and negatives as reviewed in the HPI. ____________________________________________   PHYSICAL EXAM:  VITAL SIGNS: Vitals:   07/18/18 0145 07/18/18 0200 07/18/18 0215 07/18/18 0249  BP: 126/76 129/81 128/83 136/85  Pulse: 70 70 70 72  Resp: 13   14  Temp:      TempSrc:      SpO2: 92% 96% 93% 94%     Constitutional: Alert and oriented. Well appearing and in no acute distress. Eyes: Conjunctivae are normal. PERRL. EOMI. Head: Atraumatic. Nose: No congestion/rhinnorhea. Mouth/Throat: Mucous membranes are moist.  Oropharynx non-erythematous. Neck: No stridor.  No meningeal signs.   Cardiovascular: Normal rate, regular rhythm. Good peripheral circulation. Grossly normal heart sounds.  Respiratory: Normal respiratory effort.  No retractions. Lungs CTAB. Gastrointestinal: Soft and nontender. No distention.  Musculoskeletal: No lower extremity tenderness nor edema. No gross deformities of extremities aside from right shoulder.  Neurologic:  Normal speech and language. No gross focal neurologic deficits are appreciated.  Skin:  Skin is warm, dry and intact. No rash noted.   ____________________________________________    RADIOLOGY  Dg Shoulder Right  Result Date: 07/18/2018  CLINICAL DATA:  The patient states his right shoulder was dislocated yesterday, he was seen in [place] and shoulder was reduced. Later on in the day he was reaching backwards and up to grab something and his shoulder dislocated again. Severe pain to right shoulder. Hx of right shoulder arthroscopy with rotator cuff repair, subacromial decompression, and bankart repair. EXAM: RIGHT SHOULDER - 2+ VIEW COMPARISON:  01/26/2014 FINDINGS: The humeral head is dislocated anteriorly and inferiorly in relation to the glenoid. There is an irregular cortical depression along the posterior, superior aspect of the humeral head consistent with a Bankart lesion. Widened AC joint consistent previous resection of the distal clavicle for rotator cuff repair as described in the history. Acromion and distal clavicle are normally aligned. Soft tissues are unremarkable. IMPRESSION: 1. Anterior inferior dislocation of the humeral head with a Bankart deformity along the posterior margin of the humeral head. Electronically Signed   By: Amie Portland M.D.   On: 07/18/2018 00:19   Dg Shoulder Right Portable  Result Date: 07/18/2018 CLINICAL DATA:  Post reduction EXAM: PORTABLE RIGHT SHOULDER COMPARISON:  07/17/2018 FINDINGS: Interval reduction of the previously seen dislocated right shoulder. Hill-Sachs deformity noted. Prior resection of the distal right clavicle. IMPRESSION: Interval reduction. Hill-Sachs deformity in the superolateral humeral head. Electronically Signed   By: Charlett Nose M.D.   On: 07/18/2018 02:09    ____________________________________________   PROCEDURES  Procedure(s) performed:   .Sedation Date/Time: 07/18/2018 12:56 AM Performed by: Marily Memos, MD Authorized by: Marily Memos, MD   Consent:    Consent obtained:  Verbal   Consent given by:  Patient   Risks discussed:  Allergic reaction, dysrhythmia, inadequate sedation, nausea, prolonged hypoxia resulting in organ damage, prolonged sedation  necessitating reversal, respiratory compromise necessitating ventilatory assistance and intubation and vomiting   Alternatives discussed:  Analgesia without sedation, anxiolysis and regional anesthesia Universal protocol:    Procedure explained and questions answered to patient or proxy's satisfaction: yes     Relevant documents present and verified: yes     Test results available and properly labeled: yes     Imaging studies available: yes     Required blood products, implants, devices, and special equipment available: yes     Site/side marked: yes     Immediately prior to procedure a time out was called: yes     Patient identity confirmation method:  Verbally with patient Indications:    Procedure performed:  Dislocation reduction   Procedure necessitating sedation performed by:  Physician performing sedation Pre-sedation assessment:    Time since last food or drink:  3 hours   ASA classification: class 1 - normal, healthy patient     Neck mobility: normal     Mouth opening:  3 or more finger widths   Thyromental distance:  4 finger widths   Mallampati score:  I - soft palate, uvula, fauces, pillars visible   Pre-sedation assessments completed and reviewed: airway patency, cardiovascular function, hydration status, mental status, nausea/vomiting, pain level, respiratory function and temperature     Pre-sedation assessment completed:  07/18/2018 12:57 AM Immediate pre-procedure details:    Reassessment: Patient reassessed immediately prior to procedure     Reviewed: vital signs, relevant labs/tests and NPO status     Verified: bag valve mask available, emergency equipment available, intubation equipment available, IV patency confirmed, oxygen available and suction available   Procedure details (see MAR for exact dosages):    Preoxygenation:  Nasal cannula   Sedation:  Propofol   Intra-procedure monitoring:  Blood pressure monitoring, cardiac monitor, continuous pulse oximetry, frequent  LOC assessments, frequent vital sign checks and continuous capnometry   Intra-procedure events: none     Total Provider sedation time (minutes):  22 Post-procedure details:    Attendance: Constant attendance by certified staff until patient recovered     Recovery: Patient returned to pre-procedure baseline     Post-sedation assessments completed and reviewed: airway patency, cardiovascular function, hydration status, mental status, nausea/vomiting, pain level, respiratory function and temperature     Patient is stable for discharge or admission: yes     Patient tolerance:  Tolerated well, no immediate complications  .Ortho Injury Treatment Date/Time: 07/18/2018 6:28 AM Performed by: Marily Memos, MD Authorized by: Marily Memos, MD   Consent:    Consent obtained:  Verbal   Consent given by:  Patient   Risks discussed:  Irreducible dislocation, fracture, recurrent dislocation and restricted joint movement   Alternatives discussed:  No treatmentInjury location: shoulder Location details: right shoulder Injury type: dislocation Dislocation type: anterior Hill-Sachs deformity: yes Chronicity: new Pre-procedure neurovascular assessment: neurovascularly intact Pre-procedure distal perfusion: normal Pre-procedure neurological function: normal Pre-procedure range of motion: reduced  Anesthesia: Local anesthesia used: no  Patient sedated: Yes. Refer to sedation procedure documentation for details of sedation. Manipulation performed: yes Reduction method: external rotation Reduction successful: yes X-ray confirmed reduction: yes Immobilization: shoulder immobilizer. Post-procedure neurovascular assessment: post-procedure neurovascularly intact Patient tolerance: Patient tolerated the procedure well with no immediate complications      ____________________________________________   INITIAL IMPRESSION / ASSESSMENT AND PLAN / ED COURSE  Will attempt reduction with pain control.  If that doesn't work, will sedate for same.  Attempted without success. R/B of procedural sedation discussed,, will sedate with etomidate.    Reduced easily. Needs close ortho follow up as is likely unstable. Will wear immobilizer until follow up occurs.     Pertinent labs & imaging results that were available during my care of the patient were reviewed by me and considered in my medical decision making (see chart for details).  ____________________________________________  FINAL CLINICAL IMPRESSION(S) / ED DIAGNOSES  Final diagnoses:  Recurrent dislocation of right shoulder     MEDICATIONS GIVEN DURING THIS VISIT:  Medications  HYDROmorphone (DILAUDID) injection 1 mg (1 mg Intravenous Given 07/17/18 2358)  etomidate (AMIDATE) injection 10 mg (10 mg Intravenous Given 07/18/18 0110)  sodium chloride 0.9 % bolus 500 mL (0 mLs Intravenous Stopped 07/18/18 0214)  etomidate (AMIDATE) injection (10 mg Intravenous Given 07/18/18 0218)     NEW OUTPATIENT MEDICATIONS STARTED DURING THIS VISIT:  Discharge Medication List as of 07/18/2018  2:39 AM      Note:  This note was prepared with assistance of Dragon voice recognition software. Occasional wrong-word or sound-a-like substitutions may have occurred due to the inherent limitations of voice recognition software.   Redina Zeller, Barbara Cower, MD 07/18/18 0630

## 2018-07-18 ENCOUNTER — Emergency Department (HOSPITAL_COMMUNITY): Payer: Self-pay

## 2018-07-18 MED ORDER — ETOMIDATE 2 MG/ML IV SOLN
10.0000 mg | Freq: Once | INTRAVENOUS | Status: AC
  Administered 2018-07-18: 10 mg via INTRAVENOUS
  Filled 2018-07-18: qty 10

## 2018-07-18 MED ORDER — SODIUM CHLORIDE 0.9 % IV BOLUS
500.0000 mL | Freq: Once | INTRAVENOUS | Status: AC
  Administered 2018-07-18: 500 mL via INTRAVENOUS

## 2018-07-18 MED ORDER — ETOMIDATE 2 MG/ML IV SOLN
INTRAVENOUS | Status: AC | PRN
  Administered 2018-07-18: 10 mg via INTRAVENOUS

## 2018-07-18 NOTE — ED Notes (Signed)
ED Provider at bedside. 

## 2018-07-18 NOTE — ED Notes (Signed)
Patient transported to X-ray 

## 2018-08-05 DIAGNOSIS — E782 Mixed hyperlipidemia: Secondary | ICD-10-CM | POA: Diagnosis not present

## 2018-08-05 DIAGNOSIS — Z6826 Body mass index (BMI) 26.0-26.9, adult: Secondary | ICD-10-CM | POA: Diagnosis not present

## 2018-08-05 DIAGNOSIS — Z23 Encounter for immunization: Secondary | ICD-10-CM | POA: Diagnosis not present

## 2018-08-05 DIAGNOSIS — F331 Major depressive disorder, recurrent, moderate: Secondary | ICD-10-CM | POA: Diagnosis not present

## 2018-08-05 DIAGNOSIS — Z0001 Encounter for general adult medical examination with abnormal findings: Secondary | ICD-10-CM | POA: Diagnosis not present

## 2018-08-05 DIAGNOSIS — M1000 Idiopathic gout, unspecified site: Secondary | ICD-10-CM | POA: Diagnosis not present

## 2018-08-05 DIAGNOSIS — M24411 Recurrent dislocation, right shoulder: Secondary | ICD-10-CM | POA: Diagnosis not present

## 2018-08-05 DIAGNOSIS — I10 Essential (primary) hypertension: Secondary | ICD-10-CM | POA: Diagnosis not present

## 2018-08-05 DIAGNOSIS — R972 Elevated prostate specific antigen [PSA]: Secondary | ICD-10-CM | POA: Diagnosis not present

## 2018-08-07 DIAGNOSIS — S43004A Unspecified dislocation of right shoulder joint, initial encounter: Secondary | ICD-10-CM | POA: Diagnosis not present

## 2018-10-07 DIAGNOSIS — Z1212 Encounter for screening for malignant neoplasm of rectum: Secondary | ICD-10-CM | POA: Diagnosis not present

## 2018-10-07 DIAGNOSIS — Z1211 Encounter for screening for malignant neoplasm of colon: Secondary | ICD-10-CM | POA: Diagnosis not present

## 2019-02-18 DIAGNOSIS — E782 Mixed hyperlipidemia: Secondary | ICD-10-CM | POA: Diagnosis not present

## 2019-02-18 DIAGNOSIS — I10 Essential (primary) hypertension: Secondary | ICD-10-CM | POA: Diagnosis not present

## 2019-02-18 DIAGNOSIS — N400 Enlarged prostate without lower urinary tract symptoms: Secondary | ICD-10-CM | POA: Diagnosis not present

## 2019-02-26 DIAGNOSIS — Z6826 Body mass index (BMI) 26.0-26.9, adult: Secondary | ICD-10-CM | POA: Diagnosis not present

## 2019-02-26 DIAGNOSIS — E782 Mixed hyperlipidemia: Secondary | ICD-10-CM | POA: Diagnosis not present

## 2019-02-26 DIAGNOSIS — F331 Major depressive disorder, recurrent, moderate: Secondary | ICD-10-CM | POA: Diagnosis not present

## 2019-02-26 DIAGNOSIS — I10 Essential (primary) hypertension: Secondary | ICD-10-CM | POA: Diagnosis not present

## 2019-02-26 DIAGNOSIS — M24411 Recurrent dislocation, right shoulder: Secondary | ICD-10-CM | POA: Diagnosis not present

## 2019-02-26 DIAGNOSIS — M1000 Idiopathic gout, unspecified site: Secondary | ICD-10-CM | POA: Diagnosis not present

## 2019-02-26 DIAGNOSIS — Z23 Encounter for immunization: Secondary | ICD-10-CM | POA: Diagnosis not present

## 2020-01-23 DIAGNOSIS — M1000 Idiopathic gout, unspecified site: Secondary | ICD-10-CM | POA: Diagnosis not present

## 2020-01-23 DIAGNOSIS — I10 Essential (primary) hypertension: Secondary | ICD-10-CM | POA: Diagnosis not present

## 2020-01-23 DIAGNOSIS — Z6826 Body mass index (BMI) 26.0-26.9, adult: Secondary | ICD-10-CM | POA: Diagnosis not present

## 2020-01-23 DIAGNOSIS — Z23 Encounter for immunization: Secondary | ICD-10-CM | POA: Diagnosis not present

## 2020-01-23 DIAGNOSIS — F331 Major depressive disorder, recurrent, moderate: Secondary | ICD-10-CM | POA: Diagnosis not present

## 2020-01-23 DIAGNOSIS — E782 Mixed hyperlipidemia: Secondary | ICD-10-CM | POA: Diagnosis not present

## 2020-01-23 DIAGNOSIS — Z0000 Encounter for general adult medical examination without abnormal findings: Secondary | ICD-10-CM | POA: Diagnosis not present

## 2020-05-14 DIAGNOSIS — R6883 Chills (without fever): Secondary | ICD-10-CM | POA: Diagnosis not present

## 2020-05-14 DIAGNOSIS — Z20828 Contact with and (suspected) exposure to other viral communicable diseases: Secondary | ICD-10-CM | POA: Diagnosis not present

## 2020-07-16 DIAGNOSIS — I10 Essential (primary) hypertension: Secondary | ICD-10-CM | POA: Diagnosis not present

## 2020-07-16 DIAGNOSIS — Z125 Encounter for screening for malignant neoplasm of prostate: Secondary | ICD-10-CM | POA: Diagnosis not present

## 2020-07-16 DIAGNOSIS — E7849 Other hyperlipidemia: Secondary | ICD-10-CM | POA: Diagnosis not present

## 2020-07-16 DIAGNOSIS — E782 Mixed hyperlipidemia: Secondary | ICD-10-CM | POA: Diagnosis not present

## 2020-07-16 DIAGNOSIS — E7801 Familial hypercholesterolemia: Secondary | ICD-10-CM | POA: Diagnosis not present

## 2020-07-16 DIAGNOSIS — Z1159 Encounter for screening for other viral diseases: Secondary | ICD-10-CM | POA: Diagnosis not present

## 2020-07-16 DIAGNOSIS — Z1329 Encounter for screening for other suspected endocrine disorder: Secondary | ICD-10-CM | POA: Diagnosis not present

## 2020-07-16 DIAGNOSIS — E7800 Pure hypercholesterolemia, unspecified: Secondary | ICD-10-CM | POA: Diagnosis not present

## 2020-07-19 DIAGNOSIS — Z1389 Encounter for screening for other disorder: Secondary | ICD-10-CM | POA: Diagnosis not present

## 2020-07-19 DIAGNOSIS — M1000 Idiopathic gout, unspecified site: Secondary | ICD-10-CM | POA: Diagnosis not present

## 2020-07-19 DIAGNOSIS — Z1331 Encounter for screening for depression: Secondary | ICD-10-CM | POA: Diagnosis not present

## 2020-07-19 DIAGNOSIS — I10 Essential (primary) hypertension: Secondary | ICD-10-CM | POA: Diagnosis not present

## 2020-07-19 DIAGNOSIS — F331 Major depressive disorder, recurrent, moderate: Secondary | ICD-10-CM | POA: Diagnosis not present

## 2020-07-19 DIAGNOSIS — N400 Enlarged prostate without lower urinary tract symptoms: Secondary | ICD-10-CM | POA: Diagnosis not present

## 2020-07-19 DIAGNOSIS — E7849 Other hyperlipidemia: Secondary | ICD-10-CM | POA: Diagnosis not present

## 2020-07-19 DIAGNOSIS — Z6826 Body mass index (BMI) 26.0-26.9, adult: Secondary | ICD-10-CM | POA: Diagnosis not present

## 2020-11-28 DIAGNOSIS — K047 Periapical abscess without sinus: Secondary | ICD-10-CM | POA: Diagnosis not present

## 2021-01-24 DIAGNOSIS — Z0001 Encounter for general adult medical examination with abnormal findings: Secondary | ICD-10-CM | POA: Diagnosis not present

## 2021-01-24 DIAGNOSIS — E782 Mixed hyperlipidemia: Secondary | ICD-10-CM | POA: Diagnosis not present

## 2021-01-24 DIAGNOSIS — I10 Essential (primary) hypertension: Secondary | ICD-10-CM | POA: Diagnosis not present

## 2021-02-23 ENCOUNTER — Encounter (HOSPITAL_COMMUNITY): Payer: Self-pay

## 2021-02-23 ENCOUNTER — Emergency Department (HOSPITAL_COMMUNITY): Payer: Medicare Other

## 2021-02-23 ENCOUNTER — Other Ambulatory Visit: Payer: Self-pay

## 2021-02-23 DIAGNOSIS — S43014A Anterior dislocation of right humerus, initial encounter: Secondary | ICD-10-CM | POA: Insufficient documentation

## 2021-02-23 DIAGNOSIS — S43004D Unspecified dislocation of right shoulder joint, subsequent encounter: Secondary | ICD-10-CM | POA: Diagnosis not present

## 2021-02-23 DIAGNOSIS — Y33XXXA Other specified events, undetermined intent, initial encounter: Secondary | ICD-10-CM | POA: Diagnosis not present

## 2021-02-23 DIAGNOSIS — S43004A Unspecified dislocation of right shoulder joint, initial encounter: Secondary | ICD-10-CM | POA: Diagnosis not present

## 2021-02-23 NOTE — ED Triage Notes (Signed)
Pt states he dislocated his right shoulder while sleeping, this has happened multiple times and he has been seen here for this.

## 2021-02-24 ENCOUNTER — Emergency Department (HOSPITAL_COMMUNITY): Payer: Medicare Other

## 2021-02-24 ENCOUNTER — Emergency Department (HOSPITAL_COMMUNITY)
Admission: EM | Admit: 2021-02-24 | Discharge: 2021-02-24 | Disposition: A | Discharge status: Home / Self Care | Payer: Medicare Other | Attending: Emergency Medicine | Admitting: Emergency Medicine

## 2021-02-24 DIAGNOSIS — S43006A Unspecified dislocation of unspecified shoulder joint, initial encounter: Secondary | ICD-10-CM

## 2021-02-24 DIAGNOSIS — S43014A Anterior dislocation of right humerus, initial encounter: Secondary | ICD-10-CM | POA: Diagnosis not present

## 2021-02-24 DIAGNOSIS — S43004D Unspecified dislocation of right shoulder joint, subsequent encounter: Secondary | ICD-10-CM | POA: Diagnosis not present

## 2021-02-24 MED ORDER — FENTANYL CITRATE PF 50 MCG/ML IJ SOSY
100.0000 ug | PREFILLED_SYRINGE | Freq: Once | INTRAMUSCULAR | Status: AC
  Administered 2021-02-24: 100 ug via INTRAVENOUS
  Filled 2021-02-24: qty 2

## 2021-02-24 MED ORDER — SODIUM CHLORIDE 0.9 % IV BOLUS
500.0000 mL | Freq: Once | INTRAVENOUS | Status: AC
  Administered 2021-02-24: 500 mL via INTRAVENOUS

## 2021-02-24 MED ORDER — ETOMIDATE 2 MG/ML IV SOLN
0.1000 mg/kg | Freq: Once | INTRAVENOUS | Status: AC
  Administered 2021-02-24: 8.4 mg via INTRAVENOUS
  Filled 2021-02-24: qty 10

## 2021-02-24 NOTE — ED Provider Notes (Signed)
[facility] EMERGENCY DEPARTMENT Provider Note   CSN: [id number] Arrival date & time: 02/23/21  2236     History No chief complaint on file.   Jeff Shaw is a 65 y.o. male.   Shoulder Pain Location:  Shoulder Shoulder location:  R shoulder Injury: no   Pain details:    Quality:  Aching and sharp   Radiates to:  R shoulder   Severity:  Moderate   Onset quality:  Gradual   Duration:  3 hours   Timing:  Constant Handedness:  Right-handed Dislocation: yes   Prior injury to area:  Yes Relieved by:  None tried Worsened by:  Nothing Ineffective treatments:  None tried     Past Medical History:  Diagnosis Date   Depression    Dislocation of right shoulder joint 03/13/2014   Gout    High cholesterol    Hypertension     Patient Active Problem List   Diagnosis Date Noted   Dislocation of right shoulder joint 03/13/2014   Complete tear of right rotator cuff 03/13/2014    Past Surgical History:  Procedure Laterality Date   COLONOSCOPY     ROTATOR CUFF REPAIR  2005   right shoulder   SHOULDER ARTHROSCOPY WITH BANKART REPAIR Right 03/13/2014   Procedure: RIGHT SHOULDER ARTHROSCOPY WITH DEBRIDEMENT AND BANKART REPAIR;  Surgeon: Eulas Post, MD;  Location: [facility];  Service: Orthopedics;  Laterality: Right;   SHOULDER ARTHROSCOPY WITH ROTATOR CUFF REPAIR AND SUBACROMIAL DECOMPRESSION Right 03/13/2014   Procedure: ARTHROSCOPIC ROTATOR CUFF REPAIR AND SUBACROMIAL DECOMPRESSION;  Surgeon: Eulas Post, MD;  Location: [facility];  Service: Orthopedics;  Laterality: Right;       No family history on file.  Social History   Tobacco Use   Smoking status: Never   Smokeless tobacco: Never  Substance Use Topics   Alcohol use: No    Comment: occassionally    Drug use: No    Home Medications Prior to Admission medications   Medication Sig Start Date End Date Taking? Authorizing Provider  baclofen (LIORESAL) 10 MG tablet Take  1 tablet (10 mg total) by mouth 3 (three) times daily. As needed for muscle spasm 03/13/14   Teryl Lucy, MD  busPIRone (BUSPAR) 15 MG tablet Take 15 mg by mouth daily. 08/12/14   [provider]  citalopram (CELEXA) 20 MG tablet Take 20 mg by mouth daily.    [provider]  diphenhydrAMINE (BENADRYL) 25 MG tablet Take 50 mg by mouth every 6 (six) hours as needed for itching.    [provider]  diphenhydrAMINE (BENADRYL) 25 MG tablet Take 1 tablet (25 mg total) by mouth every 6 (six) hours. 05/25/17   Vanetta Mulders, MD  EPINEPHrine (EPIPEN 2-PAK) 0.3 mg/0.3 mL IJ SOAJ injection Inject 0.3 mLs (0.3 mg total) into the muscle once. 09/26/14   Raeford Razor, MD  famotidine (PEPCID) 20 MG tablet Take 1 tablet (20 mg total) by mouth 2 (two) times daily. 05/25/17   Vanetta Mulders, MD  metoprolol succinate (TOPROL-XL) 50 MG 24 hr tablet Take 50 mg by mouth daily. Take with or immediately following a meal.    [provider]  ondansetron (ZOFRAN) 4 MG tablet Take 1 tablet (4 mg total) by mouth every 8 (eight) hours as needed for nausea or vomiting. 03/13/14   Teryl Lucy, MD  oxyCODONE-acetaminophen (PERCOCET) 10-325 MG per tablet Take 1-2 tablets by mouth every 6 (six) hours as needed for pain. MAXIMUM TOTAL  ACETAMINOPHEN DOSE IS 4000 MG PER DAY 03/13/14   Teryl Lucy, MD  predniSONE (DELTASONE) 10 MG tablet Take 4 tablets (40 mg total) by mouth daily. 05/25/17   Vanetta Mulders, MD  sennosides-docusate sodium (SENOKOT-S) 8.6-50 MG tablet Take 2 tablets by mouth daily. 03/13/14   Teryl Lucy, MD  simvastatin (ZOCOR) 40 MG tablet Take 40 mg by mouth daily.    [provider]    Allergies    Lisinopril  Review of Systems   Review of Systems  All other systems reviewed and are negative.  Physical Exam Updated Vital Signs BP 136/69   Pulse (!) 59   Temp 98.4 F (36.9 C)   Resp 15   Ht 5\' 10"  (1.778 m)   Wt 83.9 kg   SpO2 99%   BMI 26.54  kg/m   Physical Exam Vitals and nursing note reviewed.  Constitutional:      Appearance: He is well-developed.  HENT:     Head: Normocephalic and atraumatic.     Mouth/Throat:     Mouth: Mucous membranes are moist.     Pharynx: Oropharynx is clear.  Eyes:     Pupils: Pupils are equal, round, and reactive to light.  Cardiovascular:     Rate and Rhythm: Normal rate.  Pulmonary:     Effort: Pulmonary effort is normal. No respiratory distress.  Abdominal:     General: Abdomen is flat. There is no distension.  Musculoskeletal:        General: Deformity (r shoulder) present. Normal range of motion.     Cervical back: Normal range of motion.  Skin:    General: Skin is warm and dry.  Neurological:     General: No focal deficit present.     Mental Status: He is alert.    ED Results / Procedures / Treatments   Labs (all labs ordered are listed, but only abnormal results are displayed) Labs Reviewed - No data to display  EKG None  Radiology DG Shoulder Right  Result Date: 02/24/2021 CLINICAL DATA:  Evaluate for shoulder dislocation. EXAM: RIGHT SHOULDER - 2+ VIEW COMPARISON:  July 18, 2018 FINDINGS: There is anterior dislocation of the right shoulder. No acute fracture deformity is identified. A chronic appearing deformity is seen along the greater tubercle of the right humeral head. There is no evidence of arthropathy or other focal bone abnormality. Soft tissues are unremarkable. IMPRESSION: Anterior dislocation of the right shoulder without evidence of an acute fracture. Electronically Signed   By: July 20, 2018 M.D.   On: 02/24/2021 00:21   DG Shoulder Right Portable  Result Date: 02/24/2021 CLINICAL DATA:  Status post reduction of shoulder dislocation. EXAM: PORTABLE RIGHT SHOULDER COMPARISON:  February 23, 2021 FINDINGS: There is no evidence of an acute fracture or dislocation. Chronic deformities are seen involving the distal right clavicle and greater tubercle of the  right humeral head. Soft tissues are unremarkable. IMPRESSION: No acute fracture or dislocation. Electronically Signed   By: February 25, 2021 M.D.   On: 02/24/2021 03:54    Procedures .Ortho Injury Treatment  Date/Time: 02/24/2021 5:20 AM Performed by: 02/26/2021, MD Authorized by: Marily Memos, MD   Consent:    Consent obtained:  Verbal and written   Consent given by:  Patient   Risks discussed:  Irreducible dislocation, fracture, recurrent dislocation, vascular damage, restricted joint movement, stiffness and nerve damage   Alternatives discussed:  No treatmentInjury location: shoulder Location details: right shoulder Injury type: dislocation Dislocation type: anterior Hill-Sachs  deformity: no Chronicity: recurrent Pre-procedure neurovascular assessment: neurovascularly intact Pre-procedure distal perfusion: normal Pre-procedure neurological function: normal Pre-procedure range of motion: reduced  Anesthesia: Local anesthesia used: no  Patient sedated: Yes. Refer to sedation procedure documentation for details of sedation. Manipulation performed: yes Reduction method: Milch technique Reduction successful: yes X-ray confirmed reduction: yes Immobilization: sling Splint Applied by: ED Provider Post-procedure neurovascular assessment: post-procedure neurovascularly intact Post-procedure distal perfusion: normal Post-procedure neurological function: normal Post-procedure range of motion: improved   .Sedation  Date/Time: 02/24/2021 5:22 AM Performed by: Marily Memos, MD Authorized by: Marily Memos, MD   Consent:    Consent obtained:  Written and verbal   Consent given by:  Patient   Risks discussed:  Allergic reaction, dysrhythmia, vomiting, nausea, inadequate sedation and respiratory compromise necessitating ventilatory assistance and intubation   Alternatives discussed:  Analgesia without sedation Universal protocol:    Immediately prior to procedure, a time  out was called: yes   Indications:    Procedure performed:  Dislocation reduction   Procedure necessitating sedation performed by:  Physician performing sedation Pre-sedation assessment:    Time since last food or drink:  4 hours   ASA classification: class 2 - patient with mild systemic disease     Mouth opening:  2 finger widths   Mallampati score:  II - soft palate, uvula, fauces visible   Neck mobility: normal     Pre-sedation assessments completed and reviewed: airway patency, cardiovascular function, hydration status, mental status, nausea/vomiting, pain level, respiratory function and temperature   Immediate pre-procedure details:    Reassessment: Patient reassessed immediately prior to procedure   Procedure details (see MAR for exact dosages):    Preoxygenation:  Nasal cannula   Sedation:  Etomidate   Intended level of sedation: deep   Analgesia:  Fentanyl   Intra-procedure monitoring:  Continuous pulse oximetry, cardiac monitor, blood pressure monitoring, continuous capnometry, frequent LOC assessments and frequent vital sign checks   Intra-procedure events: none     Total Provider sedation time (minutes):  21 Post-procedure details:    Post-sedation assessments completed and reviewed: airway patency, cardiovascular function, hydration status, mental status, nausea/vomiting, pain level, respiratory function and temperature     Procedure completion:  Tolerated well, no immediate complications   Medications Ordered in ED Medications  etomidate (AMIDATE) injection 8.4 mg (8.4 mg Intravenous Given 02/24/21 0316)  sodium chloride 0.9 % bolus 500 mL (0 mLs Intravenous Stopped 02/24/21 0427)  fentaNYL (SUBLIMAZE) injection 100 mcg (100 mcg Intravenous Given 02/24/21 0314)    ED Course  I have reviewed the triage vital signs and the nursing notes.  Pertinent labs & imaging results that were available during my care of the patient were reviewed by me and considered in my medical  decision making (see chart for details).    MDM Rules/Calculators/A&P                         Recurrent dislocation. Reduced. Fu w/ orthopedics.   Final Clinical Impression(s) / ED Diagnoses Final diagnoses:  Anterior dislocation of right shoulder, initial encounter    Rx / DC Orders ED Discharge Orders     None        Malek Skog, Barbara Cower, MD 02/24/21 [phone]

## 2021-02-28 ENCOUNTER — Other Ambulatory Visit: Payer: Self-pay | Admitting: Orthopedic Surgery

## 2021-02-28 DIAGNOSIS — S43004D Unspecified dislocation of right shoulder joint, subsequent encounter: Secondary | ICD-10-CM | POA: Diagnosis not present

## 2021-02-28 DIAGNOSIS — M25519 Pain in unspecified shoulder: Secondary | ICD-10-CM

## 2021-03-18 ENCOUNTER — Ambulatory Visit
Admission: RE | Admit: 2021-03-18 | Discharge: 2021-03-18 | Disposition: A | Discharge status: Home / Self Care | Payer: Medicare Other | Source: Ambulatory Visit | Attending: Orthopedic Surgery | Admitting: Orthopedic Surgery

## 2021-03-18 ENCOUNTER — Other Ambulatory Visit: Payer: Self-pay

## 2021-03-18 DIAGNOSIS — M25511 Pain in right shoulder: Secondary | ICD-10-CM | POA: Diagnosis not present

## 2021-03-18 DIAGNOSIS — M25519 Pain in unspecified shoulder: Secondary | ICD-10-CM

## 2021-07-13 NOTE — Progress Notes (Signed)
Sent message, via epic in basket, requesting orders in epic from surgeon.  

## 2021-07-14 DIAGNOSIS — M12811 Other specific arthropathies, not elsewhere classified, right shoulder: Secondary | ICD-10-CM | POA: Diagnosis present

## 2021-07-15 DIAGNOSIS — S43004D Unspecified dislocation of right shoulder joint, subsequent encounter: Secondary | ICD-10-CM | POA: Diagnosis not present

## 2021-07-15 DIAGNOSIS — M25511 Pain in right shoulder: Secondary | ICD-10-CM | POA: Diagnosis not present

## 2021-07-18 NOTE — Progress Notes (Signed)
DUE TO COVID-19 ONLY ONE VISITOR IS ALLOWED TO COME WITH YOU AND STAY IN THE WAITING ROOM ONLY DURING PRE OP AND PROCEDURE DAY OF SURGERY.  2 VISITOR  MAY VISIT WITH YOU AFTER SURGERY IN YOUR PRIVATE ROOM DURING VISITING HOURS ONLY! ?YOU MAY HAVE ONE PERSON SPEND THE NITE WITH YOU IN YOUR ROOM AFTER SURGERY.   ? ? ? Your procedure is scheduled on:  ?               07/26/21  ? Report to [facility] Main  Entrance ? ? Report to admitting at     1045             AM ?DO NOT BRING INSURANCE CARD, PICTURE ID OR WALLET DAY OF SURGERY.  ?  ? ? Call this number if you have problems the morning of surgery [phone]  ? ? REMEMBER: NO  SOLID FOODS , CANDY, GUM OR MINTS AFTER MIDNITE THE NITE BEFORE SURGERY .       Marland Kitchen CLEAR LIQUIDS UNTIL      1030am            DAY OF SURGERY.      PLEASE FINISH ENSURE DRINK PER SURGEON ORDER  WHICH NEEDS TO BE COMPLETED AT  1030am        MORNING OF SURGERY.   ? ? ? ? ?CLEAR LIQUID DIET ? ? ?Foods Allowed      ?WATER ?BLACK COFFEE ( SUGAR OK, NO MILK, CREAM OR CREAMER) REGULAR AND DECAF  ?TEA ( SUGAR OK NO MILK, CREAM, OR CREAMER) REGULAR AND DECAF  ?PLAIN JELLO ( NO RED)  ?FRUIT ICES ( NO RED, NO FRUIT PULP)  ?POPSICLES ( NO RED)  ?JUICE- APPLE, WHITE GRAPE AND WHITE CRANBERRY  ?SPORT DRINK LIKE GATORADE ( NO RED)  ?CLEAR BROTH ( VEGETABLE , CHICKEN OR BEEF)                                                               ? ?    ? ?BRUSH YOUR TEETH MORNING OF SURGERY AND RINSE YOUR MOUTH OUT, NO CHEWING GUM CANDY OR MINTS. ?  ? ? Take these medicines the morning of surgery with A SIP OF WATER:  none  ? ? ?DO NOT TAKE ANY DIABETIC MEDICATIONS DAY OF YOUR SURGERY ?                  ?            You may not have any metal on your body including hair pins and  ?            piercings  Do not wear jewelry, make-up, lotions, powders or perfumes, deodorant ?            Do not wear nail polish on your fingernails.   ?           IF YOU ARE A MALE AND WANT TO SHAVE UNDER ARMS OR LEGS PRIOR TO  SURGERY YOU MUST DO SO AT LEAST 48 HOURS PRIOR TO SURGERY.  ?            Men may shave face and neck. ? ? Do not bring valuables to the hospital.  IS NOT ?  RESPONSIBLE   FOR VALUABLES. ? Contacts, dentures or bridgework may not be worn into surgery. ? Leave suitcase in the car. After surgery it may be brought to your room. ? ?  ? Patients discharged the day of surgery will not be allowed to drive home. IF YOU ARE HAVING SURGERY AND GOING HOME THE SAME DAY, YOU MUST HAVE AN ADULT TO DRIVE YOU HOME AND BE WITH YOU FOR 24 HOURS. YOU MAY GO HOME BY TAXI OR UBER OR ORTHERWISE, BUT AN ADULT MUST ACCOMPANY YOU HOME AND STAY WITH YOU FOR 24 HOURS. ?  ? ?            Please read over the following fact sheets you were given: ?_____________________________________________________________________ ? ? - Preparing for Surgery ?Before surgery, you can play an important role.  Because skin is not sterile, your skin needs to be as free of germs as possible.  You can reduce the number of germs on your skin by washing with CHG (chlorahexidine gluconate) soap before surgery.  CHG is an antiseptic cleaner which kills germs and bonds with the skin to continue killing germs even after washing. ?Please DO NOT use if you have an allergy to CHG or antibacterial soaps.  If your skin becomes reddened/irritated stop using the CHG and inform your nurse when you arrive at Short Stay. ?Do not shave (including legs and underarms) for at least 48 hours prior to the first CHG shower.  You may shave your face/neck. ?Please follow these instructions carefully: ? 1.  Shower with CHG Soap the night before surgery and the  morning of Surgery. ? 2.  If you choose to wash your hair, wash your hair first as usual with your  normal  shampoo. ? 3.  After you shampoo, rinse your hair and body thoroughly to remove the  shampoo.                           4.  Use CHG as you would any other liquid soap.  You can apply chg directly   to the skin and wash  ?                     Gently with a scrungie or clean washcloth. ? 5.  Apply the CHG Soap to your body ONLY FROM THE NECK DOWN.   Do not use on face/ open      ?                     Wound or open sores. Avoid contact with eyes, ears mouth and genitals (private parts).  ?                     Production manager,  Genitals (private parts) with your normal soap. ?            6.  Wash thoroughly, paying special attention to the area where your surgery  will be performed. ? 7.  Thoroughly rinse your body with warm water from the neck down. ? 8.  DO NOT shower/wash with your normal soap after using and rinsing off  the CHG Soap. ?               9.  Pat yourself dry with a clean towel. ?           10.  Wear clean pajamas. ?  11.  Place clean sheets on your bed the night of your first shower and do not  sleep with pets. ?Day of Surgery : ?Do not apply any lotions/deodorants the morning of surgery.  Please wear clean clothes to the hospital/surgery center. ? ?FAILURE TO FOLLOW THESE INSTRUCTIONS MAY RESULT IN THE CANCELLATION OF YOUR SURGERY ?PATIENT SIGNATURE_________________________________ ? ?NURSE SIGNATURE__________________________________ ? ?________________________________________________________________________  ? ? ?           ?

## 2021-07-18 NOTE — Progress Notes (Signed)
[facility]- Preparing for Total Shoulder Arthroplasty  °  °Before surgery, you can play an important role. Because skin is not sterile, your skin needs to be as free of germs as possible. You can reduce the number of germs on your skin by using the following products. °Benzoyl Peroxide Gel °Reduces the number of germs present on the skin °Applied twice a day to shoulder area starting two days before surgery   ° °================================================================== ° °Please follow these instructions carefully: ° °BENZOYL PEROXIDE 5% GEL ° °Please do not use if you have an allergy to benzoyl peroxide.   If your skin becomes reddened/irritated stop using the benzoyl peroxide. ° °Starting two days before surgery, apply as follows: °Apply benzoyl peroxide in the morning and at night. Apply after taking a shower. If you are not taking a shower clean entire shoulder front, back, and side along with the armpit with a clean wet washcloth. ° °Place a quarter-sized dollop on your shoulder and rub in thoroughly, making sure to cover the front, back, and side of your shoulder, along with the armpit.  ° °2 days before ____ AM   ____ PM              1 day before ____ AM   ____ PM °                        °Do this twice a day for two days.  (Last application is the night before surgery, AFTER using the CHG soap as described below). ° °Do NOT apply benzoyl peroxide gel on the day of surgery.  °

## 2021-07-18 NOTE — Progress Notes (Addendum)
Anesthesia Review: ? ?PCP: DR Jeff Shaw - requested most recent checkup/clearance.  They are to fax.   ?LOV 07/19/21 on chart.  ?Cardiologist : none  ?Chest x-ray : ?EKG : ?Echo : ?Stress test: ?Cardiac Cath :  ?Activity level: can do a flgiht of stairs without difficulty  ?Sleep Study/ CPAP : none  ?Fasting Blood Sugar :      / Checks Blood Sugar -- times a day:   ?Blood Thinner/ Instructions /Last Dose: ?ASA / Instructions/ Last Dose :   ?

## 2021-07-19 DIAGNOSIS — Z23 Encounter for immunization: Secondary | ICD-10-CM | POA: Diagnosis not present

## 2021-07-19 DIAGNOSIS — M1000 Idiopathic gout, unspecified site: Secondary | ICD-10-CM | POA: Diagnosis not present

## 2021-07-19 DIAGNOSIS — Z0001 Encounter for general adult medical examination with abnormal findings: Secondary | ICD-10-CM | POA: Diagnosis not present

## 2021-07-19 DIAGNOSIS — E7849 Other hyperlipidemia: Secondary | ICD-10-CM | POA: Diagnosis not present

## 2021-07-19 DIAGNOSIS — I10 Essential (primary) hypertension: Secondary | ICD-10-CM | POA: Diagnosis not present

## 2021-07-20 ENCOUNTER — Encounter (HOSPITAL_COMMUNITY): Payer: Self-pay

## 2021-07-20 ENCOUNTER — Other Ambulatory Visit: Payer: Self-pay

## 2021-07-20 ENCOUNTER — Encounter (HOSPITAL_COMMUNITY)
Admission: RE | Admit: 2021-07-20 | Discharge: 2021-07-20 | Disposition: A | Discharge status: Home / Self Care | Payer: Medicare Other | Source: Ambulatory Visit | Attending: Orthopedic Surgery | Admitting: Orthopedic Surgery

## 2021-07-20 VITALS — BP 135/90 | HR 78 | Temp 98.6°F | Resp 16 | Ht 68.0 in | Wt 185.0 lb

## 2021-07-20 DIAGNOSIS — Z01812 Encounter for preprocedural laboratory examination: Secondary | ICD-10-CM | POA: Diagnosis not present

## 2021-07-20 DIAGNOSIS — Z01818 Encounter for other preprocedural examination: Secondary | ICD-10-CM

## 2021-07-20 DIAGNOSIS — M19019 Primary osteoarthritis, unspecified shoulder: Secondary | ICD-10-CM | POA: Insufficient documentation

## 2021-07-20 HISTORY — DX: Gastro-esophageal reflux disease without esophagitis: K21.9

## 2021-07-20 HISTORY — DX: Unspecified osteoarthritis, unspecified site: M19.90

## 2021-07-20 HISTORY — DX: Anxiety disorder, unspecified: F41.9

## 2021-07-20 LAB — CBC
HCT: 43.6 % (ref 39.0–52.0)
Hemoglobin: 15 g/dL (ref 13.0–17.0)
MCH: 31.1 pg (ref 26.0–34.0)
MCHC: 34.4 g/dL (ref 30.0–36.0)
MCV: 90.3 fL (ref 80.0–100.0)
Platelets: 265 10*3/uL (ref 150–400)
RBC: 4.83 MIL/uL (ref 4.22–5.81)
RDW: 13 % (ref 11.5–15.5)
WBC: 10.3 10*3/uL (ref 4.0–10.5)
nRBC: 0 % (ref 0.0–0.2)

## 2021-07-20 LAB — SURGICAL PCR SCREEN
MRSA, PCR: NEGATIVE
Staphylococcus aureus: NEGATIVE

## 2021-07-25 NOTE — H&P (Signed)
SHOULDER ARTHROPLASTY ADMISSION H&P ? ?Patient ID: ?Jeff Shaw ?MRN: [id number] ?DOB/AGE: 10-22-1955 66 y.o. ? ?Chief Complaint: right shoulder pain. ? ?Planned Procedure Date: 06/30/21 ?Medical Clearance by Dr. Donzetta Sprung  ?Additional clearance by Dr. Regino Schultze, DDS (dental) ? ?HPI: ?Jeff Shaw is a 66 y.o. male who presents for evaluation of RIGHT SHOULDER DJD. The patient has a history of pain and functional disability in the right shoulder due to arthritis and has failed non-surgical conservative treatments for greater than 12 weeks to include NSAID's and/or analgesics, flexibility and strengthening excercises, and activity modification.  Onset of symptoms was gradual, starting >10 years ago with gradually worsening course since that time. The patient noted  prior arthroscopy with rotator cuff repaits in 2005 and 2015  on the right shoulder.  Patient currently rates pain at 3 out of 10 with activity. Patient has worsening of pain with activity and weight bearing and pain that interferes with activities of daily living.  Patient has evidence of joint space narrowing by imaging studies.  There is no active infection. ? ?Past Medical History:  ?Diagnosis Date  ? Anxiety   ? Arthritis   ? Dislocation of right shoulder joint 03/13/2014  ? GERD (gastroesophageal reflux disease)   ? Gout   ? High cholesterol   ? ?Past Surgical History:  ?Procedure Laterality Date  ? COLONOSCOPY    ? ROTATOR CUFF REPAIR  2005  ? right shoulder  ? SHOULDER ARTHROSCOPY WITH BANKART REPAIR Right 03/13/2014  ? Procedure: RIGHT SHOULDER ARTHROSCOPY WITH DEBRIDEMENT AND BANKART REPAIR;  Surgeon: Eulas Post, MD;  Location: [facility];  Service: Orthopedics;  Laterality: Right;  ? SHOULDER ARTHROSCOPY WITH ROTATOR CUFF REPAIR AND SUBACROMIAL DECOMPRESSION Right 03/13/2014  ? Procedure: ARTHROSCOPIC ROTATOR CUFF REPAIR AND SUBACROMIAL DECOMPRESSION;  Surgeon: Eulas Post, MD;  Location: [facility];  Service:  Orthopedics;  Laterality: Right;  ? ?Allergies  ?Allergen Reactions  ? Lisinopril Anaphylaxis and Swelling  ? ?Prior to Admission medications   ?Medication Sig Start Date End Date Taking? Authorizing Provider  ?Carboxymethylcellul-Glycerin (LUBRICATING EYE DROPS OP) Place 1 drop into both eyes daily as needed (irritation).   Yes [provider]  ?citalopram (CELEXA) 20 MG tablet Take 20 mg by mouth at bedtime.   Yes [provider]  ?diphenhydramine-acetaminophen (TYLENOL PM) 25-500 MG TABS tablet Take 1 tablet by mouth at bedtime.   Yes [provider]  ?ibuprofen (ADVIL) 200 MG tablet Take 200-400 mg by mouth every 6 (six) hours as needed for moderate pain.   Yes [provider]  ?tamsulosin (FLOMAX) 0.4 MG CAPS capsule Take 0.4 mg by mouth at bedtime.   Yes [provider]  ? ?Social History  ? ?Socioeconomic History  ? Marital status: Married  ?  Spouse name: Not on file  ? Number of children: Not on file  ? Years of education: Not on file  ? Highest education level: Not on file  ?Occupational History  ? Not on file  ?Tobacco Use  ? Smoking status: Former  ?  Types: Cigarettes  ? Smokeless tobacco: Never  ?Vaping Use  ? Vaping Use: Never used  ?Substance and Sexual Activity  ? Alcohol use: Yes  ?  Comment: R[employer]ER  ? Drug use: No  ? Sexual activity: Not on file  ?Other Topics Concern  ? Not on file  ?Social History Narrative  ? Not on file  ? ?Social Determinants of Health  ? ?Financial Resource Strain: Not  on file  ?Food Insecurity: Not on file  ?Transportation Needs: Not on file  ?Physical Activity: Not on file  ?Stress: Not on file  ?Social Connections: Not on file  ? ?No family history on file. ? ?ROS: Currently denies lightheadedness, dizziness, Fever, chills, CP, SOB.   ?No personal history of DVT, PE, MI, or CVA. ?No loose teeth or dentures ?All other systems have been reviewed and were otherwise currently negative with the exception of those mentioned in the  HPI and as above. ? ?Objective: ?Vitals: Ht: 5'8" Wt: 184.6 lbs Temp: 97.5 BP: 161/89 Pulse: 74 O2 97% on room air.   ?Physical Exam: ?General: Alert, NAD.   ?HEENT: EOMI, Good Neck Extension  ?Pulm: No increased work of breathing.  Clear B/L A/P w/o crackle or wheeze.  ?CV: RRR, No m/g/r appreciated  ?GI: soft, NT, ND ?Neuro: Neuro without gross focal deficit.  Sensation intact distally ?Skin: No lesions in the area of chief complaint ?MSK/Surgical Site: right shoulder pain with range of motion.  Forward flexion/abduction approximately 170.  Internal rotation to L1.  External rotation to neutral.  No AC pain.  Mild Biceps pain. NVI distally. ? ?Imaging Review ?CT of the right shoulder shows mild GH OA with impaction fracture of the posterior superior humeral head with prior rotator cuff and labral repair with muscle atrophy ? ?Assessment: ?Right shoulder recurrent glenohumeral dislocation with history of massive cuff repair for the treatment of recurrent dislocations ? ? ?Plan: ?Plan for Procedure(s): ?REVERSE SHOULDER ARTHROPLASTY ? ?The patient history, physical exam, clinical judgement of the provider and imaging are consistent with end stage degenerative joint disease and reverse total joint arthroplasty is deemed medically necessary. The treatment options including medical management, injection therapy, and arthroplasty were discussed at length. The risks and benefits of Procedure(s): ?REVERSE SHOULDER ARTHROPLASTY were presented and reviewed.  ?The risks of nonoperative treatment, versus surgical intervention including but not limited to continued pain, aseptic loosening, stiffness, dislocation/subluxation, infection, bleeding, nerve injury, blood clots, cardiopulmonary complications, morbidity, mortality, among others were discussed. The patient verbalizes understanding and wishes to proceed with the plan.  ?Patient is being admitted for surgery, OT, pain control, prophylactic antibiotics, VTE prophylaxis,  progressive ambulation, ADL's and discharge planning.  ? ?Dental prophylaxis discussed and recommended for 2 years postoperatively. ? ?The patient does meet the criteria for TXA which will be used perioperatively.   ?The patient is planning to be discharged home care of wife. ? ? ?Armida Sans, PA-C ?07/25/2021 ?1:01 PM ?  ?

## 2021-07-26 ENCOUNTER — Ambulatory Visit (HOSPITAL_COMMUNITY)
Admission: RE | Admit: 2021-07-26 | Discharge: 2021-07-26 | Disposition: A | Discharge status: Home / Self Care | Payer: Medicare Other | Attending: Orthopedic Surgery | Admitting: Orthopedic Surgery

## 2021-07-26 ENCOUNTER — Other Ambulatory Visit: Payer: Self-pay

## 2021-07-26 ENCOUNTER — Encounter (HOSPITAL_COMMUNITY)
Admission: RE | Disposition: A | Discharge status: Home / Self Care | Payer: Self-pay | Source: Home / Self Care | Attending: Orthopedic Surgery

## 2021-07-26 ENCOUNTER — Ambulatory Visit (HOSPITAL_COMMUNITY): Payer: Medicare Other | Admitting: Physician Assistant

## 2021-07-26 ENCOUNTER — Ambulatory Visit (HOSPITAL_COMMUNITY): Payer: Medicare Other

## 2021-07-26 ENCOUNTER — Encounter (HOSPITAL_COMMUNITY): Payer: Self-pay | Admitting: Orthopedic Surgery

## 2021-07-26 ENCOUNTER — Ambulatory Visit (HOSPITAL_BASED_OUTPATIENT_CLINIC_OR_DEPARTMENT_OTHER): Payer: Medicare Other | Admitting: Certified Registered Nurse Anesthetist

## 2021-07-26 DIAGNOSIS — Z96611 Presence of right artificial shoulder joint: Secondary | ICD-10-CM | POA: Diagnosis not present

## 2021-07-26 DIAGNOSIS — M19011 Primary osteoarthritis, right shoulder: Secondary | ICD-10-CM | POA: Insufficient documentation

## 2021-07-26 DIAGNOSIS — M12811 Other specific arthropathies, not elsewhere classified, right shoulder: Secondary | ICD-10-CM | POA: Diagnosis not present

## 2021-07-26 DIAGNOSIS — Z87891 Personal history of nicotine dependence: Secondary | ICD-10-CM | POA: Insufficient documentation

## 2021-07-26 DIAGNOSIS — S43004A Unspecified dislocation of right shoulder joint, initial encounter: Secondary | ICD-10-CM | POA: Diagnosis not present

## 2021-07-26 DIAGNOSIS — F419 Anxiety disorder, unspecified: Secondary | ICD-10-CM | POA: Diagnosis not present

## 2021-07-26 DIAGNOSIS — M24411 Recurrent dislocation, right shoulder: Secondary | ICD-10-CM | POA: Diagnosis not present

## 2021-07-26 DIAGNOSIS — G8918 Other acute postprocedural pain: Secondary | ICD-10-CM | POA: Diagnosis not present

## 2021-07-26 DIAGNOSIS — Z471 Aftercare following joint replacement surgery: Secondary | ICD-10-CM | POA: Diagnosis not present

## 2021-07-26 HISTORY — PX: REVERSE SHOULDER ARTHROPLASTY: SHX5054

## 2021-07-26 SURGERY — ARTHROPLASTY, SHOULDER, TOTAL, REVERSE
Anesthesia: General | Site: Shoulder | Laterality: Right

## 2021-07-26 MED ORDER — ONDANSETRON HCL 4 MG PO TABS: 4.0000 mg | ORAL_TABLET | Freq: Three times a day (TID) | ORAL | 0 refills | Status: DC | PRN

## 2021-07-26 MED ORDER — BUPIVACAINE HCL (PF) 0.5 % IJ SOLN
INTRAMUSCULAR | Status: DC | PRN
  Administered 2021-07-26: 15 mL via PERINEURAL

## 2021-07-26 MED ORDER — DEXAMETHASONE SODIUM PHOSPHATE 10 MG/ML IJ SOLN
INTRAMUSCULAR | Status: AC
  Filled 2021-07-26: qty 1

## 2021-07-26 MED ORDER — PROPOFOL 10 MG/ML IV BOLUS
INTRAVENOUS | Status: AC
  Filled 2021-07-26: qty 20

## 2021-07-26 MED ORDER — SENNA-DOCUSATE SODIUM 8.6-50 MG PO TABS: 2.0000 | ORAL_TABLET | Freq: Every day | ORAL | 1 refills | Status: DC

## 2021-07-26 MED ORDER — FENTANYL CITRATE PF 50 MCG/ML IJ SOSY: 25.0000 ug | PREFILLED_SYRINGE | INTRAMUSCULAR | Status: DC | PRN

## 2021-07-26 MED ORDER — DEXAMETHASONE SODIUM PHOSPHATE 10 MG/ML IJ SOLN
INTRAMUSCULAR | Status: DC | PRN
  Administered 2021-07-26: 10 mg via INTRAVENOUS

## 2021-07-26 MED ORDER — OXYCODONE HCL 5 MG PO TABS: 5.0000 mg | ORAL_TABLET | ORAL | 0 refills | Status: DC | PRN

## 2021-07-26 MED ORDER — OXYCODONE HCL 5 MG/5ML PO SOLN: 5.0000 mg | Freq: Once | ORAL | Status: DC | PRN

## 2021-07-26 MED ORDER — ONDANSETRON HCL 4 MG/2ML IJ SOLN: 4.0000 mg | Freq: Once | INTRAMUSCULAR | Status: DC | PRN

## 2021-07-26 MED ORDER — PHENYLEPHRINE HCL-NACL 20-0.9 MG/250ML-% IV SOLN
INTRAVENOUS | Status: DC | PRN
  Administered 2021-07-26: 35 ug/min via INTRAVENOUS

## 2021-07-26 MED ORDER — SUGAMMADEX SODIUM 200 MG/2ML IV SOLN
INTRAVENOUS | Status: DC | PRN
  Administered 2021-07-26: 200 mg via INTRAVENOUS

## 2021-07-26 MED ORDER — ONDANSETRON HCL 4 MG/2ML IJ SOLN
INTRAMUSCULAR | Status: AC
  Filled 2021-07-26: qty 2

## 2021-07-26 MED ORDER — EPHEDRINE 5 MG/ML INJ
INTRAVENOUS | Status: AC
  Filled 2021-07-26: qty 5

## 2021-07-26 MED ORDER — DEXMEDETOMIDINE (PRECEDEX) IN NS 20 MCG/5ML (4 MCG/ML) IV SYRINGE
PREFILLED_SYRINGE | INTRAVENOUS | Status: AC
  Filled 2021-07-26: qty 5

## 2021-07-26 MED ORDER — ROCURONIUM BROMIDE 10 MG/ML (PF) SYRINGE
PREFILLED_SYRINGE | INTRAVENOUS | Status: AC
  Filled 2021-07-26: qty 10

## 2021-07-26 MED ORDER — LACTATED RINGERS IV SOLN: INTRAVENOUS | Status: DC

## 2021-07-26 MED ORDER — PROPOFOL 10 MG/ML IV BOLUS
INTRAVENOUS | Status: DC | PRN
  Administered 2021-07-26: 120 mg via INTRAVENOUS

## 2021-07-26 MED ORDER — EPHEDRINE SULFATE-NACL 50-0.9 MG/10ML-% IV SOSY
PREFILLED_SYRINGE | INTRAVENOUS | Status: DC | PRN
  Administered 2021-07-26: 10 mg via INTRAVENOUS
  Administered 2021-07-26: 5 mg via INTRAVENOUS
  Administered 2021-07-26: 10 mg via INTRAVENOUS

## 2021-07-26 MED ORDER — FENTANYL CITRATE (PF) 100 MCG/2ML IJ SOLN
INTRAMUSCULAR | Status: AC
  Filled 2021-07-26: qty 2

## 2021-07-26 MED ORDER — OXYCODONE HCL 5 MG PO TABS: 5.0000 mg | ORAL_TABLET | Freq: Once | ORAL | Status: DC | PRN

## 2021-07-26 MED ORDER — CEFAZOLIN SODIUM-DEXTROSE 2-4 GM/100ML-% IV SOLN
2.0000 g | INTRAVENOUS | Status: AC
  Administered 2021-07-26: 2 g via INTRAVENOUS
  Filled 2021-07-26: qty 100

## 2021-07-26 MED ORDER — FENTANYL CITRATE (PF) 100 MCG/2ML IJ SOLN
INTRAMUSCULAR | Status: DC | PRN
Start: 2021-07-26 — End: 2021-07-26
  Administered 2021-07-26: 100 ug via INTRAVENOUS

## 2021-07-26 MED ORDER — POVIDONE-IODINE 10 % EX SWAB
2.0000 "application " | Freq: Once | CUTANEOUS | Status: AC
  Administered 2021-07-26: 2 via TOPICAL

## 2021-07-26 MED ORDER — TRANEXAMIC ACID-NACL 1000-0.7 MG/100ML-% IV SOLN
1000.0000 mg | INTRAVENOUS | Status: AC
  Administered 2021-07-26: 1000 mg via INTRAVENOUS
  Filled 2021-07-26: qty 100

## 2021-07-26 MED ORDER — MIDAZOLAM HCL 2 MG/2ML IJ SOLN
INTRAMUSCULAR | Status: AC
  Administered 2021-07-26: 1 mg
  Filled 2021-07-26: qty 2

## 2021-07-26 MED ORDER — ACETAMINOPHEN 325 MG PO TABS: 650.0000 mg | ORAL_TABLET | Freq: Four times a day (QID) | ORAL | 1 refills | Status: DC | PRN

## 2021-07-26 MED ORDER — BACLOFEN 10 MG PO TABS: 10.0000 mg | ORAL_TABLET | Freq: Three times a day (TID) | ORAL | 0 refills | Status: DC

## 2021-07-26 MED ORDER — BUPIVACAINE LIPOSOME 1.3 % IJ SUSP
INTRAMUSCULAR | Status: DC | PRN
  Administered 2021-07-26: 10 mL via PERINEURAL

## 2021-07-26 MED ORDER — LIDOCAINE 2% (20 MG/ML) 5 ML SYRINGE
INTRAMUSCULAR | Status: DC | PRN
  Administered 2021-07-26: 80 mg via INTRAVENOUS

## 2021-07-26 MED ORDER — ORAL CARE MOUTH RINSE
15.0000 mL | Freq: Once | OROMUCOSAL | Status: AC
  Administered 2021-07-26: 15 mL via OROMUCOSAL

## 2021-07-26 MED ORDER — ROCURONIUM BROMIDE 10 MG/ML (PF) SYRINGE
PREFILLED_SYRINGE | INTRAVENOUS | Status: DC | PRN
  Administered 2021-07-26: 20 mg via INTRAVENOUS
  Administered 2021-07-26: 10 mg via INTRAVENOUS
  Administered 2021-07-26: 70 mg via INTRAVENOUS

## 2021-07-26 MED ORDER — LACTATED RINGERS IV BOLUS
500.0000 mL | Freq: Once | INTRAVENOUS | Status: AC
  Administered 2021-07-26: 500 mL via INTRAVENOUS

## 2021-07-26 MED ORDER — ONDANSETRON HCL 4 MG/2ML IJ SOLN
INTRAMUSCULAR | Status: DC | PRN
  Administered 2021-07-26: 4 mg via INTRAVENOUS

## 2021-07-26 MED ORDER — LIDOCAINE HCL (PF) 2 % IJ SOLN
INTRAMUSCULAR | Status: AC
  Filled 2021-07-26: qty 5

## 2021-07-26 MED ORDER — 0.9 % SODIUM CHLORIDE (POUR BTL) OPTIME
TOPICAL | Status: DC | PRN
  Administered 2021-07-26: 1000 mL

## 2021-07-26 MED ORDER — FENTANYL CITRATE PF 50 MCG/ML IJ SOSY
PREFILLED_SYRINGE | INTRAMUSCULAR | Status: AC
  Administered 2021-07-26: 50 ug
  Filled 2021-07-26: qty 2

## 2021-07-26 MED ORDER — ACETAMINOPHEN 500 MG PO TABS
1000.0000 mg | ORAL_TABLET | Freq: Once | ORAL | Status: AC
  Administered 2021-07-26: 1000 mg via ORAL
  Filled 2021-07-26: qty 2

## 2021-07-26 MED ORDER — CHLORHEXIDINE GLUCONATE 0.12 % MT SOLN: 15.0000 mL | Freq: Once | OROMUCOSAL | Status: AC

## 2021-07-26 SURGICAL SUPPLY — 62 items
BAG COUNTER SPONGE SURGICOUNT (BAG) ×1 IMPLANT
BAG ZIPLOCK 12X15 (MISCELLANEOUS) ×2 IMPLANT
BASEPLATE GLENOSPHERE 25 (Plate) ×1 IMPLANT
BEARING HUMERAL SHLDER 36M STD (Shoulder) IMPLANT
BENZOIN TINCTURE PRP APPL 2/3 (GAUZE/BANDAGES/DRESSINGS) ×1 IMPLANT
BIT DRILL TWIST 2.7 (BIT) ×1 IMPLANT
BLADE SAW SAG 73X25 THK (BLADE) ×1
BLADE SAW SGTL 73X25 THK (BLADE) ×1 IMPLANT
BOOTIES KNEE HIGH SLOAN (MISCELLANEOUS) ×2 IMPLANT
CLSR STERI-STRIP ANTIMIC 1/2X4 (GAUZE/BANDAGES/DRESSINGS) ×3 IMPLANT
COOLER ICEMAN CLASSIC (MISCELLANEOUS) ×1 IMPLANT
COVER BACK TABLE 60X90IN (DRAPES) ×2 IMPLANT
COVER MAYO STAND STRL (DRAPES) ×2 IMPLANT
COVER SURGICAL LIGHT HANDLE (MISCELLANEOUS) ×2 IMPLANT
DRAPE ORTHO SPLIT 77X108 STRL (DRAPES) ×2
DRAPE SHEET LG 3/4 BI-LAMINATE (DRAPES) ×4 IMPLANT
DRAPE SURG 17X11 SM STRL (DRAPES) ×2 IMPLANT
DRAPE SURG ORHT 6 SPLT 77X108 (DRAPES) ×2 IMPLANT
DRAPE U-SHAPE 47X51 STRL (DRAPES) ×2 IMPLANT
DRSG MEPILEX BORDER 4X8 (GAUZE/BANDAGES/DRESSINGS) ×2 IMPLANT
DURAPREP 26ML APPLICATOR (WOUND CARE) ×4 IMPLANT
ELECT REM PT RETURN 15FT ADLT (MISCELLANEOUS) ×2 IMPLANT
FACESHIELD WRAPAROUND (MASK) ×2 IMPLANT
FACESHIELD WRAPAROUND OR TEAM (MASK) IMPLANT
GLENOID SPHERE STD STRL 36MM (Orthopedic Implant) ×1 IMPLANT
GLOVE SRG 8 PF TXTR STRL LF DI (GLOVE) ×1 IMPLANT
GLOVE SURG ORTHO LTX SZ7.5 (GLOVE) ×2 IMPLANT
GLOVE SURG UNDER POLY LF SZ7 (GLOVE) ×2 IMPLANT
GLOVE SURG UNDER POLY LF SZ8 (GLOVE) ×2
HOOD PEEL AWAY FLYTE STAYCOOL (MISCELLANEOUS) ×2 IMPLANT
KIT BASIN OR (CUSTOM PROCEDURE TRAY) ×2 IMPLANT
KIT TURNOVER KIT A (KITS) IMPLANT
PACK SHOULDER (CUSTOM PROCEDURE TRAY) ×2 IMPLANT
PAD COLD SHLDR WRAP-ON (PAD) ×1 IMPLANT
PIN HUMERAL STMN 3.2MMX9IN (INSTRUMENTS) ×1 IMPLANT
PIN STEINMANN THREADED TIP (PIN) ×1 IMPLANT
PROTECTOR NERVE ULNAR (MISCELLANEOUS) ×2 IMPLANT
RESTRAINT HEAD UNIVERSAL NS (MISCELLANEOUS) ×2 IMPLANT
SCREW BONE CORT 6.5X35MM (Screw) ×1 IMPLANT
SCREW BONE LOCKING 4.75X30X3.5 (Screw) ×2 IMPLANT
SCREW LOCKING 4.75MMX15MM (Screw) ×2 IMPLANT
SHOULDER HUMERAL BEAR 36M STD (Shoulder) ×2 IMPLANT
SLING ARM IMMOBILIZER LRG (SOFTGOODS) ×2 IMPLANT
SMARTMIX MINI TOWER (MISCELLANEOUS)
SPIKE FLUID TRANSFER (MISCELLANEOUS) IMPLANT
SPONGE T-LAP 18X18 ~~LOC~~+RFID (SPONGE) ×2 IMPLANT
SPONGE T-LAP 4X18 ~~LOC~~+RFID (SPONGE) ×2 IMPLANT
STEM HUMERAL STRL 11MMX55MM (Stem) ×1 IMPLANT
SUCTION FRAZIER HANDLE 12FR (TUBING) ×1
SUCTION TUBE FRAZIER 12FR DISP (TUBING) ×1 IMPLANT
SUPPORT WRAP ARM LG (MISCELLANEOUS) ×2 IMPLANT
SUT FIBERWIRE #2 38 T-5 BLUE (SUTURE) ×2
SUT MAXBRAID #5 CCS-NDL 2PK (SUTURE) ×1 IMPLANT
SUT VIC AB 1 CT1 36 (SUTURE) ×2 IMPLANT
SUT VIC AB 2-0 CT1 27 (SUTURE) ×1
SUT VIC AB 2-0 CT1 TAPERPNT 27 (SUTURE) ×1 IMPLANT
SUT VIC AB 3-0 SH 8-18 (SUTURE) ×2 IMPLANT
SUTURE FIBERWR #2 38 T-5 BLUE (SUTURE) IMPLANT
TOWEL OR 17X26 10 PK STRL BLUE (TOWEL DISPOSABLE) ×2 IMPLANT
TOWEL OR NON WOVEN STRL DISP B (DISPOSABLE) ×2 IMPLANT
TOWER SMARTMIX MINI (MISCELLANEOUS) IMPLANT
TRAY HUM MINI SHOULDER +3 40 (Joint) ×1 IMPLANT

## 2021-07-26 NOTE — Interval H&P Note (Signed)
History and Physical Interval Note: ? ?07/26/2021 ?1:06 PM ? ?Jeff Shaw  has presented today for surgery, with the diagnosis of RIGHT SHOULDER DJD.  The various methods of treatment have been discussed with the patient and family. After consideration of risks, benefits and other options for treatment, the patient has consented to  Procedure(s): ?REVERSE SHOULDER ARTHROPLASTY (Right) as a surgical intervention.  The patient's history has been reviewed, patient examined, no change in status, stable for surgery.  I have reviewed the patient's chart and labs.  Questions were answered to the patient's satisfaction.  He has had multiple dislocations, with failed rotator cuff repair and elected for reverse arthroplasty. ? ? ?Eulas Post ? ? ?

## 2021-07-26 NOTE — Discharge Instructions (Signed)
Diet: As you were doing prior to hospitalization  ? ?Shower:  May shower but keep the wounds dry, use an occlusive plastic wrap, NO SOAKING IN TUB.  If the bandage gets wet, change with a clean dry gauze.  If you have a splint on, leave the splint in place and keep the splint dry with a plastic bag. ? ?Dressing:  You may change your dressing 3-5 days after surgery, unless you have a splint.  If you have a splint, then just leave the splint in place and we will change your bandages during your first follow-up appointment.   ? ?If you had hand or foot surgery, we will plan to remove your stitches in about 2 weeks in the office.  For all other surgeries, there are sticky tapes (steri-strips) on your wounds and all the stitches are absorbable.  Leave the steri-strips in place when changing your dressings, they will peel off with time, usually 2-3 weeks. ? ?Activity:  Increase activity slowly as tolerated, but follow the weight bearing instructions below.  The rules on driving is that you can not be taking narcotics while you drive, and you must feel in control of the vehicle.   ? ?Weight Bearing:   no bearing weight with right arm.   ? ?To prevent constipation: you may use a stool softener such as - ? ?Colace (over the counter) 100 mg by mouth twice a day  ?Drink plenty of fluids (prune juice may be helpful) and high fiber foods ?Miralax (over the counter) for constipation as needed.   ? ?Itching:  If you experience itching with your medications, try taking only a single pain pill, or even half a pain pill at a time.  You may take up to 10 pain pills per day, and you can also use benadryl over the counter for itching or also to help with sleep.  ? ?Precautions:  If you experience chest pain or shortness of breath - call 911 immediately for transfer to the hospital emergency department!! ? ?If you develop a fever greater that 101 F, purulent drainage from wound, increased redness or drainage from wound, or calf pain --  Call the office at [phone]                                                ?Follow- Up Appointment:  Please call for an appointment to be seen in 2 weeks [place] - [phone] ? ? ?  ?

## 2021-07-26 NOTE — Anesthesia Postprocedure Evaluation (Signed)
Anesthesia Post Note ? ?Patient: Jeff Shaw ? ?Procedure(s) Performed: REVERSE SHOULDER ARTHROPLASTY (Right: Shoulder) ? ?  ? ?Patient location during evaluation: PACU ?Anesthesia Type: General ?Level of consciousness: awake and alert and oriented ?Pain management: pain level controlled ?Vital Signs Assessment: post-procedure vital signs reviewed and stable ?Respiratory status: spontaneous breathing, nonlabored ventilation and respiratory function stable ?Cardiovascular status: blood pressure returned to baseline and stable ?Postop Assessment: no apparent nausea or vomiting ?Anesthetic complications: no ? ? ?No notable events documented. ? ?Last Vitals:  ?Vitals:  ? 07/26/21 1615 07/26/21 1630  ?BP: 134/85 (!) 170/80  ?Pulse: 78   ?Resp: 15 19  ?Temp:  36.7 ?C  ?SpO2: 98% 96%  ?  ?Last Pain:  ?Vitals:  ? 07/26/21 1630  ?TempSrc:   ?PainSc: 0-No pain  ? ? ?  ?  ?  ?  ?  ?  ? ?Charleston Hankin A. ? ? ? ? ?

## 2021-07-26 NOTE — Op Note (Signed)
07/26/2021 ? ?3:24 PM ? ?PATIENT:  Jeff Shaw   ? 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 ?POST-OPERATIVE DIAGNOSIS:  Same ? ?PROCEDURE: RIGHT reverse Total Shoulder Arthroplasty ? ?SURGEON:  Johnny Bridge, MD ? ?PHYSICIAN ASSISTANT: Merlene Pulling, PA-C, present and scrubbed throughout the case, critical for completion in a timely fashion, and for retraction, instrumentation, and closure. ? ?ANESTHESIA:   General with interscalene block using Exparel ? ?ESTIMATED BLOOD LOSS: 200 mL ? ?UNIQUE ASPECTS OF THE CASE: The biceps tendon looked like it already had a tenodesis, but I did so the remnant to the pectoralis tendon.  The supraspinatus and infraspinatus were fairly dysfunctional and not present.  I did cut the humerus twice, because it was still tight after the first cut, which was more like an anatomic cut.  The ultimate reduction was fairly straightforward, slightly tight, but overall satisfactory.  The conjoined tendon did not have undue tension. ? ?PREOPERATIVE INDICATIONS:  Jeff Shaw is a  66 y.o. male with a diagnosis of RIGHT SHOULDER DJD who failed conservative measures and elected for surgical management.   ? ?The risks benefits and alternatives were discussed with the patient preoperatively including but not limited to the risks of infection, bleeding, nerve injury, cardiopulmonary complications, the need for revision surgery, dislocation, brachial plexus palsy, incomplete relief of pain, among others, and the patient was willing to proceed. ? ?OPERATIVE IMPLANTS: Biomet size 11 micro humeral stem press-fit with a 40 + 3 mm offset reverse shoulder arthroplasty tray with a 36 mm Vivacit-E standardy polyethylene liner and a 36 mm glenosphere set on B placed with inferior offset, with a mini baseplate and 4 locking screws and one central nonlocking screw. ? ?OPERATIVE FINDINGS: Advanced rotator cuff dysfunction with very poor quality tissue.  The  cartilage was in reasonable condition. ? ?OPERATIVE PROCEDURE: The patient was brought to the operating room and placed in the supine position. General anesthesia was administered. IV antibiotics were given.  Time out was performed. The upper extremity was prepped and draped in usual sterile fashion. The patient was in a beachchair position. Deltopectoral approach was carried out. The biceps was tenodesed to the pectoralis tendon with #2 Fiberwire. The subscapularis was released off of the bone.  ? ?I then performed circumferential releases of the humerus, and then dislocated the head, and then reamed with the reamer to the above named size. ? ?I then applied the jig, and cut the humeral head in 30? of retroversion, and then turned my attention to the glenoid. ? ?Deep retractors were placed, and I resected the labrum, and then placed a guidepin into the center position on the glenoid, with slight inferior inclination. I then reamed over the guidepin, and this created a small metaphyseal cancellus blush inferiorly, removing just the cartilage to the subchondral bone superiorly. The base plate was selected and impacted place, and then I secured it centrally with a nonlocking screw, and I had excellent purchase both inferiorly and superiorly. I placed a short locking screws on anterior and posterior aspects. ? ?I then turned my attention to the glenosphere, and impacted this into place, placing slight inferior offset (set on B).  ? ?The glenosphere was completely seated, and had engagement of the [facility] taper. I then turned my attention back to the humerus. ? ?I sequentially broached, and then trialed, and was found to restore soft tissue tension, and it had 2 finger tightness. Therefore the above named components were selected. The shoulder felt stable  throughout functional motion. ? ?Before I placed the real prosthesis I had also placed a total of 3 #2 FiberWire through drill holes in the humerus for later  subscapularis repair. ? ?I then impacted the real prosthesis into place, as well as the real humeral tray, and reduced the shoulder. The shoulder had excellent motion, and was stable, and I irrigated the wounds copiously.  ? ? I then used these to repair the subscapularis. This came down to bone. ? ?I then irrigated the shoulder copiously once more, repaired the deltopectoral interval with Vicryl followed by subcutaneous Vicryl with Steri-Strips and sterile gauze for the skin. The patient was awakened and returned back in stable and satisfactory condition. There were no complications and He tolerated the procedure well. ? ?

## 2021-07-26 NOTE — Evaluation (Signed)
Occupational Therapy Evaluation ?Patient Details ?Name: Jeff Shaw ?MRN: [id number] ?DOB: October 23, 1955 ?Today's Date: 07/26/2021 ? ? ?History of Present Illness patient is a 66 year old male who presented with right shoulder DJD. patient is undergoing a R reverse shoulder arthroplasty. [id number], anxiety, R rotator cuff repair  ? ?Clinical Impression ?  ?Patient was seen pre op on this date pending R shoulder surgery. Therapist provided education and instruction to patient in regards to exercises, precautions, positioning, donning upper extremity clothing and bathing while maintaining shoulder precautions, ice and edema management and donning/doffing sling. Patient verbalized understanding and demonstrated as needed.  Patient to follow up with MD for further therapy needs.  ? ?   ? ?Recommendations for follow up therapy are one component of a multi-disciplinary discharge planning process, led by the attending physician.  Recommendations may be updated based on patient status, additional functional criteria and insurance authorization.  ? ?Follow Up Recommendations ? Follow physician's recommendations for discharge plan and follow up therapies  ?  ?Assistance Recommended at Discharge Frequent or constant Supervision/Assistance  ?Patient can return home with the following A little help with walking and/or transfers;A little help with bathing/dressing/bathroom;Assistance with cooking/housework;Direct supervision/assist for medications management;Direct supervision/assist for financial management;Help with stairs or ramp for entrance;Assist for transportation ? ?  ?Functional Status Assessment ? Patient has had a recent decline in their functional status and demonstrates the ability to make significant improvements in function in a reasonable and predictable amount of time.  ?Equipment Recommendations ? None recommended by OT  ?  ?Recommendations for Other Services   ? ? ?  ?Precautions / Restrictions  Precautions ?Precautions: Shoulder ?Type of Shoulder Precautions: NO ROM shoulder, ok for wrist hand and elbow ?Shoulder Interventions: Shoulder sling/immobilizer;Off for dressing/bathing/exercises;At all times ?Precaution Booklet Issued: Yes (comment) (handout) ?Restrictions ?Weight Bearing Restrictions: Yes ?RUE Weight Bearing: Non weight bearing  ? ?  ? ?Mobility   ?Balance   ? ? ?Pertinent Vitals/Pain Pain Assessment ?Pain Assessment: Faces ?Faces Pain Scale: Hurts a little bit ?Pain Location: R shoulder (patient seen preop) ?Pain Descriptors / Indicators: Discomfort ?Pain Intervention(s): Monitored during session  ? ? ? ?Hand Dominance  (had to train L hand with increased time of R shoulder injuries) ?  ?Extremity/Trunk Assessment Upper Extremity Assessment ?Upper Extremity Assessment: RUE deficits/detail ?RUE Deficits / Details: no assessed pending surgery on this UE at this time. able to AROM elbow hand and wrist and demonstrate exercises for post op ?  ?  ?  ?  ?  ?Communication   ?  ?Cognition Arousal/Alertness: Awake/alert ?Behavior During Therapy: [facility] for tasks assessed/performed ?Overall Cognitive Status: Within Functional Limits for tasks assessed ?  ?  ?  ?  ?  ?  ?  ?  ?  ?  ?  ?  ?  ?  ?  ?  ?General Comments: patient was plesant and motivated for education provided. ?  ?  ?General Comments    ? ?  ?Exercises   ?  ?Shoulder Instructions Shoulder Instructions ?Donning/doffing shirt without moving shoulder: Patient able to independently direct caregiver ?Method for sponge bathing under operated UE: Patient able to independently direct caregiver ?Donning/doffing sling/immobilizer: Patient able to independently direct caregiver ?Correct positioning of sling/immobilizer: Modified independent ?ROM for elbow, wrist and digits of operated UE: Modified independent (seen pre op able to complete one rep of each exercises) ?Sling wearing schedule (on at all times/off for ADL's): Modified independent (reported  wearing sling all times prior to surgery.) ?Positioning of  UE while sleeping: Patient able to independently direct caregiver  ? ? ?Home Living Family/patient expects to be discharged to:: Private residence ?Living Arrangements: Spouse/significant other ?  ?  ?  ?  ?  ?  ?  ?  ?  ?  ?  ?  ?  ?  ?  ?  ?  ? ?  ?Prior Functioning/Environment Prior Level of Function : Independent/Modified Independent ?  ?  ?  ?  ?  ?  ?  ?  ?  ? ?  ?  ?OT Problem List:   ?  ?   ?OT Treatment/Interventions:    ?  ?OT Goals(Current goals can be found in the care plan section) Acute Rehab OT Goals ?Patient Stated Goal: to have good surgery ?OT Goal Formulation: With patient ?Time For Goal Achievement: 08/09/21 ?Potential to Achieve Goals: Good  ?OT Frequency:   ?  ? ?Co-evaluation   ?  ?  ?  ?  ? ?  ?AM-PAC OT "6 Clicks" Daily Activity     ?Outcome Measure   ?  ?  ?  ?  ?  ?  ?  ?End of Session Nurse Communication: Other (comment) (ok to see patient) ? ?Activity Tolerance: Patient tolerated treatment well ?Patient left: in bed;with nursing/sitter in room;with call bell/phone within reach ? ?OT Visit Diagnosis: Pain ?Pain - Right/Left: Right ?Pain - part of body: Shoulder  ?              ?Time: [id number] ?OT Time Calculation (min): 14 min ?Charges:  OT General Charges ?$OT Visit: 1 Visit ?OT Evaluation ?$OT Eval Low Complexity: 1 Low ? ?Blu Lori OTR/L, MS ?Acute Rehabilitation Department ?Office# [phone] ?Pager# [phone] ? ? ?Feliz Beam Sayana Salley ?07/26/2021, 12:05 PM ?

## 2021-07-26 NOTE — Anesthesia Procedure Notes (Signed)
Procedure Name: Intubation ?Date/Time: 07/26/2021 1:17 PM ?Performed by: Maxwell Caul, CRNA ?Pre-anesthesia Checklist: Patient identified, Emergency Drugs available, Suction available and Patient being monitored ?Patient Re-evaluated:Patient Re-evaluated prior to induction ?Oxygen Delivery Method: Circle system utilized ?Preoxygenation: Pre-oxygenation with 100% oxygen ?Induction Type: IV induction ?Ventilation: Mask ventilation without difficulty ?Laryngoscope Size: Mac and 4 ?Grade View: Grade I ?Tube type: Oral ?Tube size: 7.5 mm ?Number of attempts: 1 ?Airway Equipment and Method: Stylet ?Placement Confirmation: ETT inserted through vocal cords under direct vision, positive ETCO2 and breath sounds checked- equal and bilateral ?Secured at: 22 cm ?Tube secured with: Tape ?Dental Injury: Teeth and Oropharynx as per pre-operative assessment  ? ? ? ? ?

## 2021-07-26 NOTE — Transfer of Care (Signed)
Immediate Anesthesia Transfer of Care Note ? ?Patient: Jeff Shaw ? ?Procedure(s) Performed: Procedure(s): ?REVERSE SHOULDER ARTHROPLASTY (Right) ? ?Patient Location: PACU ? ?Anesthesia Type:General ? ?Level of Consciousness: Alert, Awake, Oriented ? ?Airway & Oxygen Therapy: Patient Spontanous Breathing ? ?Post-op Assessment: Report given to RN ? ?Post vital signs: Reviewed and stable ? ?Last Vitals:  ?Vitals:  ? 07/26/21 1224 07/26/21 1225  ?BP: 137/88   ?Pulse: (!) 58 (!) 56  ?Resp: 15 11  ?Temp:    ?SpO2: 98% 96%  ? ? ?Complications: No apparent anesthesia complications ? ?

## 2021-07-26 NOTE — Anesthesia Preprocedure Evaluation (Signed)
Anesthesia Evaluation  ?Patient identified by MRN, date of birth, ID band ?Patient awake ? ? ? ?Reviewed: ?Allergy & Precautions, NPO status , Patient's Chart, lab work & pertinent test results, reviewed documented beta blocker date and time  ? ?Airway ?Mallampati: II ? ?TM Distance: >3 FB ?Neck ROM: Full ? ? ? Dental ?no notable dental hx. ?(+) Teeth Intact, Dental Advisory Given, Caps ?  ?Pulmonary ?former smoker,  ?  ?Pulmonary exam normal ?breath sounds clear to auscultation ? ? ? ? ? ? Cardiovascular ?negative cardio ROS ?Normal cardiovascular exam ?Rhythm:Regular Rate:Normal ? ? ?  ?Neuro/Psych ?Anxiety negative neurological ROS ?   ? GI/Hepatic ?Neg liver ROS, GERD  Medicated,  ?Endo/Other  ?negative endocrine ROS ? Renal/GU ?negative Renal ROS  ? ?BPH ? ?  ?Musculoskeletal ? ?(+) Arthritis , Osteoarthritis,  DJD right shoulder ?Torn Rotator Cuff right shoulder  ? Abdominal ?  ?Peds ? Hematology ?  ?Anesthesia Other Findings ? ? Reproductive/Obstetrics ? ?  ? ? ? ? ? ? ? ? ? ? ? ? ? ?  ?  ? ? ? ? ? ? ? ? ?Anesthesia Physical ?Anesthesia Plan ? ?ASA: 2 ? ?Anesthesia Plan: General  ? ?Post-op Pain Management: Regional block*  ? ?Induction:  ? ?PONV Risk Score and Plan: 3 and Treatment may vary due to age or medical condition, Ondansetron and Dexamethasone ? ?Airway Management Planned: Oral ETT ? ?Additional Equipment:  ? ?Intra-op Plan:  ? ?Post-operative Plan: Extubation in OR ? ?Informed Consent: I have reviewed the patients History and Physical, chart, labs and discussed the procedure including the risks, benefits and alternatives for the proposed anesthesia with the patient or authorized representative who has indicated his/her understanding and acceptance.  ? ? ? ?Dental advisory given ? ?Plan Discussed with: CRNA and Anesthesiologist ? ?Anesthesia Plan Comments:   ? ? ? ? ? ? ?Anesthesia Quick Evaluation ? ?

## 2021-07-26 NOTE — Progress Notes (Signed)
Assisted Dr. Foster with right, interscalene , ultrasound guided block. Side rails up, monitors on throughout procedure. See vital signs in flow sheet. Tolerated Procedure well. 

## 2021-07-26 NOTE — Anesthesia Procedure Notes (Signed)
Anesthesia Regional Block: Interscalene brachial plexus block  ? ?Pre-Anesthetic Checklist: , timeout performed,  Correct Patient, Correct Site, Correct Laterality,  Correct Procedure, Correct Position, site marked,  Risks and benefits discussed,  Surgical consent,  Pre-op evaluation,  At surgeon's request and post-op pain management ? ?Laterality: Right ? ?Prep: chloraprep     ?  ?Needles:  ?Injection technique: Single-shot ? ?Needle Type: Echogenic Stimulator Needle   ? ? ?Needle Length: 10cm  ?Needle Gauge: 21  ? ?Needle insertion depth: 6 cm ? ? ?Additional Needles: ? ? ?Procedures:,,,, ultrasound used (permanent image in chart),,   ?Motor weakness within 5 minutes.  ?Narrative:  ?Start time: 07/26/2021 12:17 PM ?End time: 07/26/2021 12:22 PM ?Injection made incrementally with aspirations every 5 mL. ? ?Performed by: Personally  ?Anesthesiologist: Josephine Igo, MD ? ?Additional Notes: ?Timeout performed. Patient sedated. Relevant anatomy ID'd using [place]. Incremental 2-74ml injection of LA with frequent aspiration. Patient tolerated procedure well. ? ? ? ?Right Interscalene Block ? ?

## 2021-07-26 NOTE — Progress Notes (Signed)
Paged PT/OT  to see pt prior to block in Short Stay- having shoulder surgery. ? ?

## 2021-07-28 ENCOUNTER — Encounter (HOSPITAL_COMMUNITY): Payer: Self-pay | Admitting: Orthopedic Surgery

## 2021-08-08 DIAGNOSIS — M19011 Primary osteoarthritis, right shoulder: Secondary | ICD-10-CM | POA: Diagnosis not present

## 2021-08-24 ENCOUNTER — Other Ambulatory Visit (HOSPITAL_COMMUNITY): Payer: Medicare Other

## 2021-09-09 DIAGNOSIS — M19011 Primary osteoarthritis, right shoulder: Secondary | ICD-10-CM | POA: Diagnosis not present

## 2021-09-14 DIAGNOSIS — M6281 Muscle weakness (generalized): Secondary | ICD-10-CM | POA: Diagnosis not present

## 2021-09-14 DIAGNOSIS — M25511 Pain in right shoulder: Secondary | ICD-10-CM | POA: Diagnosis not present

## 2021-09-14 DIAGNOSIS — Z471 Aftercare following joint replacement surgery: Secondary | ICD-10-CM | POA: Diagnosis not present

## 2021-09-16 DIAGNOSIS — M6281 Muscle weakness (generalized): Secondary | ICD-10-CM | POA: Diagnosis not present

## 2021-09-16 DIAGNOSIS — Z471 Aftercare following joint replacement surgery: Secondary | ICD-10-CM | POA: Diagnosis not present

## 2021-09-16 DIAGNOSIS — M25511 Pain in right shoulder: Secondary | ICD-10-CM | POA: Diagnosis not present

## 2021-09-20 DIAGNOSIS — M25511 Pain in right shoulder: Secondary | ICD-10-CM | POA: Diagnosis not present

## 2021-09-20 DIAGNOSIS — Z471 Aftercare following joint replacement surgery: Secondary | ICD-10-CM | POA: Diagnosis not present

## 2021-09-20 DIAGNOSIS — M6281 Muscle weakness (generalized): Secondary | ICD-10-CM | POA: Diagnosis not present

## 2021-09-22 DIAGNOSIS — M6281 Muscle weakness (generalized): Secondary | ICD-10-CM | POA: Diagnosis not present

## 2021-09-22 DIAGNOSIS — Z471 Aftercare following joint replacement surgery: Secondary | ICD-10-CM | POA: Diagnosis not present

## 2021-09-22 DIAGNOSIS — M25511 Pain in right shoulder: Secondary | ICD-10-CM | POA: Diagnosis not present

## 2021-09-30 DIAGNOSIS — Z471 Aftercare following joint replacement surgery: Secondary | ICD-10-CM | POA: Diagnosis not present

## 2021-09-30 DIAGNOSIS — M25511 Pain in right shoulder: Secondary | ICD-10-CM | POA: Diagnosis not present

## 2021-09-30 DIAGNOSIS — M6281 Muscle weakness (generalized): Secondary | ICD-10-CM | POA: Diagnosis not present

## 2021-10-03 DIAGNOSIS — M25511 Pain in right shoulder: Secondary | ICD-10-CM | POA: Diagnosis not present

## 2021-10-03 DIAGNOSIS — M6281 Muscle weakness (generalized): Secondary | ICD-10-CM | POA: Diagnosis not present

## 2021-10-03 DIAGNOSIS — Z471 Aftercare following joint replacement surgery: Secondary | ICD-10-CM | POA: Diagnosis not present

## 2021-10-06 DIAGNOSIS — M25511 Pain in right shoulder: Secondary | ICD-10-CM | POA: Diagnosis not present

## 2021-10-06 DIAGNOSIS — M6281 Muscle weakness (generalized): Secondary | ICD-10-CM | POA: Diagnosis not present

## 2021-10-06 DIAGNOSIS — Z471 Aftercare following joint replacement surgery: Secondary | ICD-10-CM | POA: Diagnosis not present

## 2021-10-11 DIAGNOSIS — M25511 Pain in right shoulder: Secondary | ICD-10-CM | POA: Diagnosis not present

## 2021-10-11 DIAGNOSIS — Z471 Aftercare following joint replacement surgery: Secondary | ICD-10-CM | POA: Diagnosis not present

## 2021-10-11 DIAGNOSIS — M6281 Muscle weakness (generalized): Secondary | ICD-10-CM | POA: Diagnosis not present

## 2021-10-14 DIAGNOSIS — M25511 Pain in right shoulder: Secondary | ICD-10-CM | POA: Diagnosis not present

## 2021-10-14 DIAGNOSIS — M6281 Muscle weakness (generalized): Secondary | ICD-10-CM | POA: Diagnosis not present

## 2021-10-14 DIAGNOSIS — Z471 Aftercare following joint replacement surgery: Secondary | ICD-10-CM | POA: Diagnosis not present

## 2021-10-17 DIAGNOSIS — Z23 Encounter for immunization: Secondary | ICD-10-CM | POA: Diagnosis not present

## 2021-10-18 DIAGNOSIS — M25511 Pain in right shoulder: Secondary | ICD-10-CM | POA: Diagnosis not present

## 2021-10-18 DIAGNOSIS — Z471 Aftercare following joint replacement surgery: Secondary | ICD-10-CM | POA: Diagnosis not present

## 2021-10-18 DIAGNOSIS — M6281 Muscle weakness (generalized): Secondary | ICD-10-CM | POA: Diagnosis not present

## 2021-10-21 DIAGNOSIS — Z471 Aftercare following joint replacement surgery: Secondary | ICD-10-CM | POA: Diagnosis not present

## 2021-10-21 DIAGNOSIS — M6281 Muscle weakness (generalized): Secondary | ICD-10-CM | POA: Diagnosis not present

## 2021-10-21 DIAGNOSIS — M25511 Pain in right shoulder: Secondary | ICD-10-CM | POA: Diagnosis not present

## 2021-10-28 DIAGNOSIS — M6281 Muscle weakness (generalized): Secondary | ICD-10-CM | POA: Diagnosis not present

## 2021-10-28 DIAGNOSIS — Z471 Aftercare following joint replacement surgery: Secondary | ICD-10-CM | POA: Diagnosis not present

## 2021-10-28 DIAGNOSIS — M25511 Pain in right shoulder: Secondary | ICD-10-CM | POA: Diagnosis not present

## 2021-11-04 DIAGNOSIS — Z471 Aftercare following joint replacement surgery: Secondary | ICD-10-CM | POA: Diagnosis not present

## 2021-11-04 DIAGNOSIS — M25511 Pain in right shoulder: Secondary | ICD-10-CM | POA: Diagnosis not present

## 2021-11-04 DIAGNOSIS — M6281 Muscle weakness (generalized): Secondary | ICD-10-CM | POA: Diagnosis not present

## 2021-11-09 DIAGNOSIS — Z1211 Encounter for screening for malignant neoplasm of colon: Secondary | ICD-10-CM | POA: Diagnosis not present

## 2021-11-09 DIAGNOSIS — Z1212 Encounter for screening for malignant neoplasm of rectum: Secondary | ICD-10-CM | POA: Diagnosis not present

## 2021-11-11 DIAGNOSIS — M19011 Primary osteoarthritis, right shoulder: Secondary | ICD-10-CM | POA: Diagnosis not present

## 2021-11-17 LAB — COLOGUARD: COLOGUARD: POSITIVE — AB

## 2021-11-17 LAB — EXTERNAL GENERIC LAB PROCEDURE: COLOGUARD: POSITIVE — AB

## 2021-12-19 DIAGNOSIS — R195 Other fecal abnormalities: Secondary | ICD-10-CM | POA: Diagnosis not present

## 2022-01-16 DIAGNOSIS — M19012 Primary osteoarthritis, left shoulder: Secondary | ICD-10-CM | POA: Diagnosis not present

## 2022-01-18 DIAGNOSIS — M1000 Idiopathic gout, unspecified site: Secondary | ICD-10-CM | POA: Diagnosis not present

## 2022-01-18 DIAGNOSIS — E7849 Other hyperlipidemia: Secondary | ICD-10-CM | POA: Diagnosis not present

## 2022-01-18 DIAGNOSIS — I10 Essential (primary) hypertension: Secondary | ICD-10-CM | POA: Diagnosis not present

## 2022-01-25 DIAGNOSIS — Z0001 Encounter for general adult medical examination with abnormal findings: Secondary | ICD-10-CM | POA: Diagnosis not present

## 2022-01-25 DIAGNOSIS — E7849 Other hyperlipidemia: Secondary | ICD-10-CM | POA: Diagnosis not present

## 2022-02-16 DIAGNOSIS — Z1211 Encounter for screening for malignant neoplasm of colon: Secondary | ICD-10-CM | POA: Diagnosis not present

## 2022-02-16 DIAGNOSIS — R195 Other fecal abnormalities: Secondary | ICD-10-CM | POA: Diagnosis not present

## 2022-02-16 DIAGNOSIS — K641 Second degree hemorrhoids: Secondary | ICD-10-CM | POA: Diagnosis not present

## 2022-02-16 DIAGNOSIS — K5730 Diverticulosis of large intestine without perforation or abscess without bleeding: Secondary | ICD-10-CM | POA: Diagnosis not present

## 2022-02-16 DIAGNOSIS — Z888 Allergy status to other drugs, medicaments and biological substances status: Secondary | ICD-10-CM | POA: Diagnosis not present

## 2022-02-16 DIAGNOSIS — F32A Depression, unspecified: Secondary | ICD-10-CM | POA: Diagnosis not present

## 2022-05-19 IMAGING — CT CT SHOULDER*R* W/O CM
1 of 2 series · 9 of 14 positions shown, 12 images · non-contrast
Comparison: Right shoulder x-rays dated February 24, 2021. MRI right
shoulder dated February 27, 2014.

CLINICAL DATA: Chronic right shoulder pain.

EXAM:
CT OF THE UPPER RIGHT EXTREMITY WITHOUT CONTRAST
TECHNIQUE: Multidetector CT imaging of the right shoulder was performed
according to the standard protocol.

[Series 3: thin soft · axial · 0.55mm/px · z∈[-257,-83]mm · 9 of 364 slices shown, 12 images]
[im 37/364  soft-tissue]
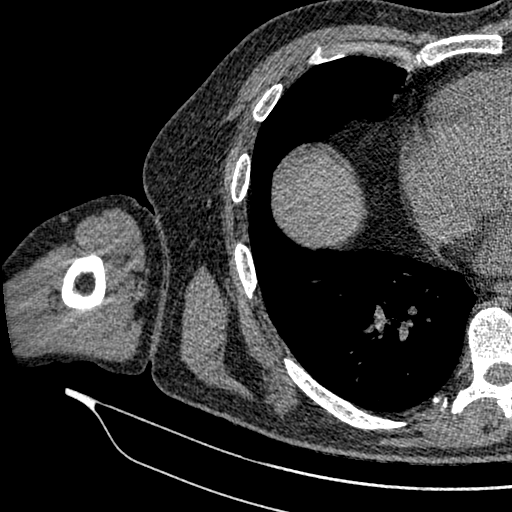
[im 37/364  bone]
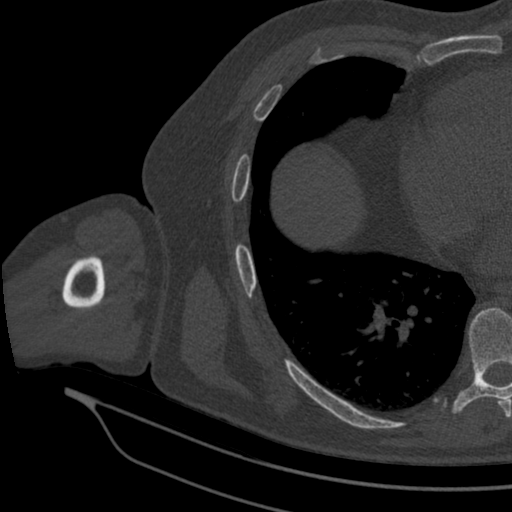
[im 73/364  bone]
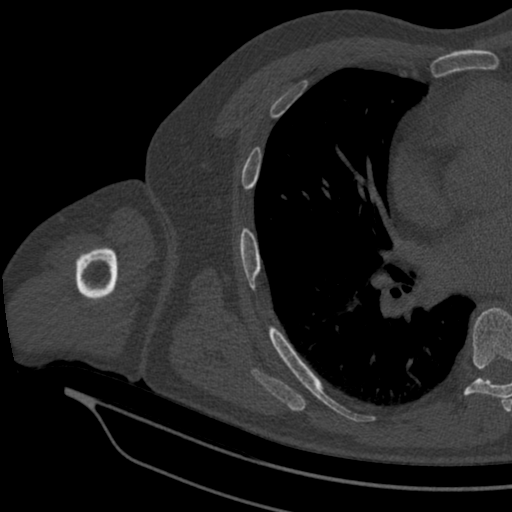
[im 109/364  bone]
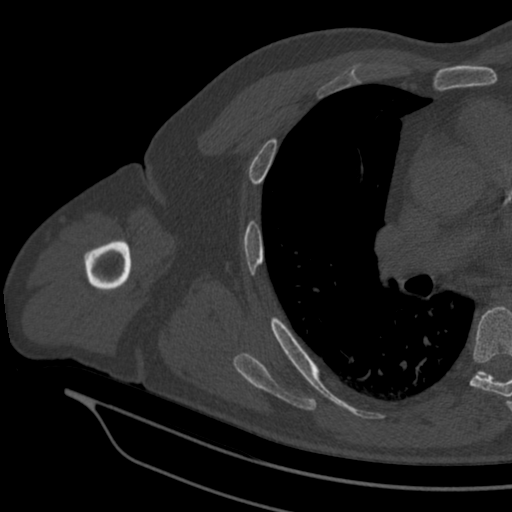
[im 146/364  bone]
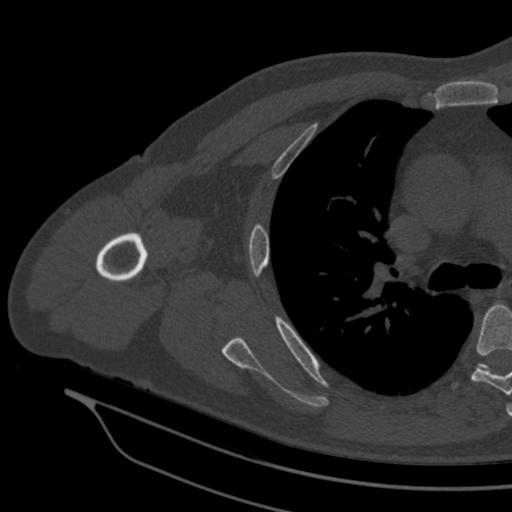
[im 182/364  soft-tissue]
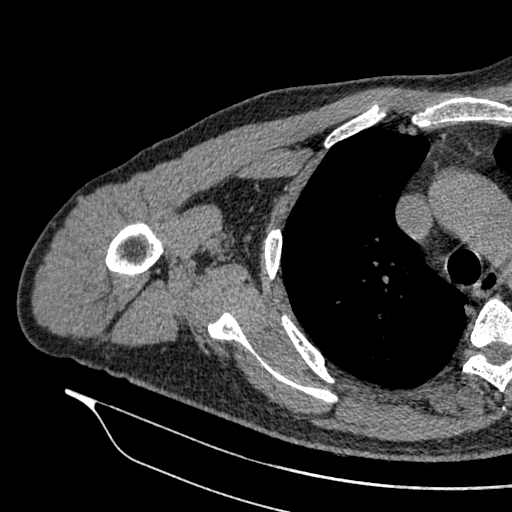
[im 182/364  bone]
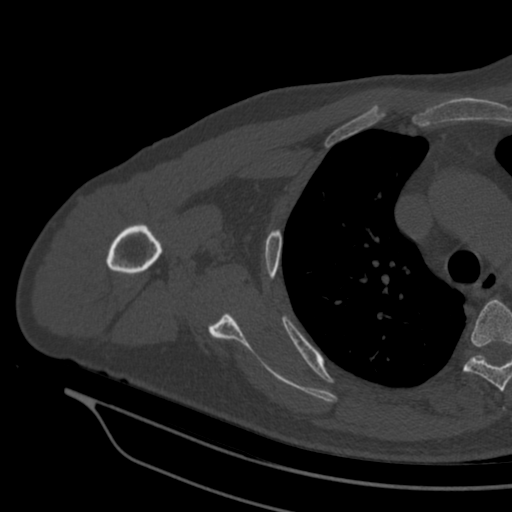
[im 218/364  bone]
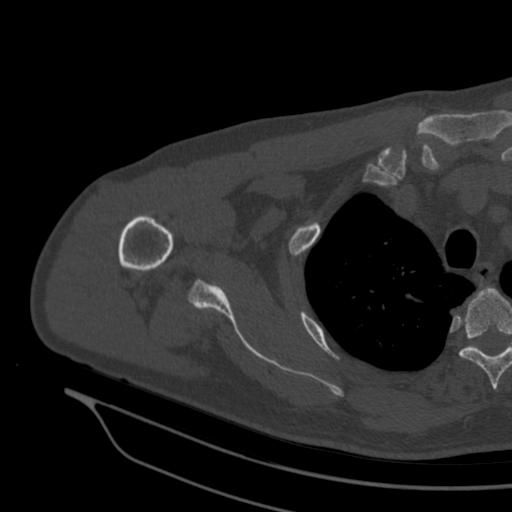
[im 255/364  bone]
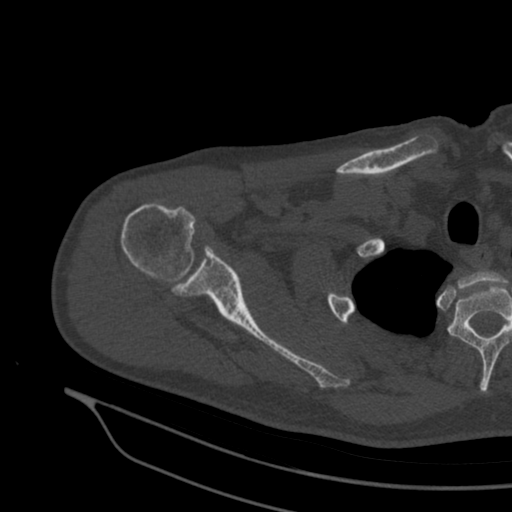
[im 291/364  bone]
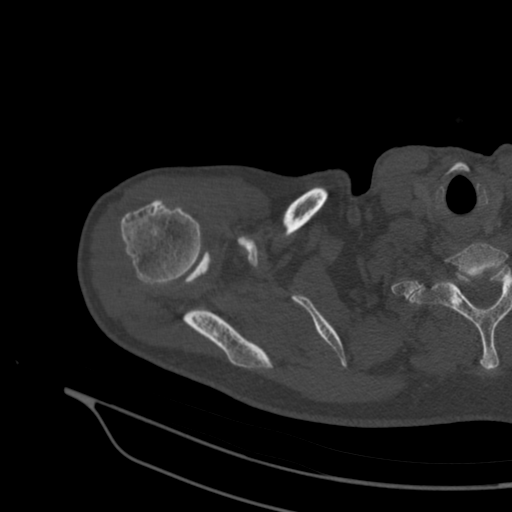
[im 327/364  soft-tissue]
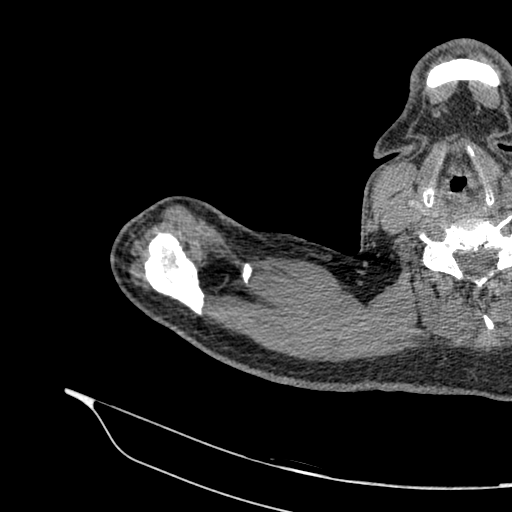
[im 327/364  bone]
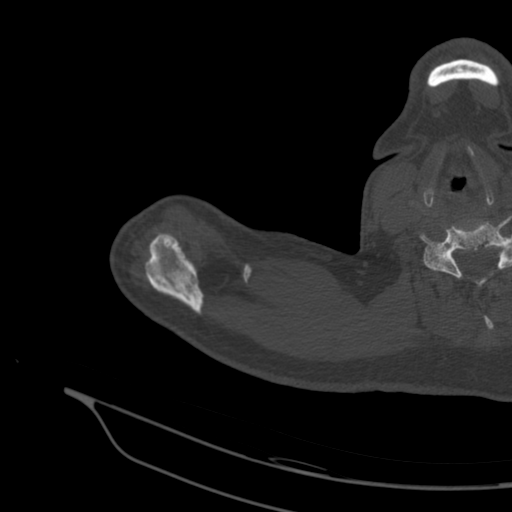

[9 of 14 positions shown; findings below may reference images not displayed]

FINDINGS: None

Bones/Joint/Cartilage

Suture anchor tracts in the greater tuberosity and glenoid from
prior rotator cuff and labral repair, respectively. No acute
fracture or dislocation. Old impaction fracture of the
posterosuperior humeral head. High-riding humeral head.

Mild osteoarthritis of the glenohumeral joint with joint space
narrowing and marginal osteophytosis. No joint effusion. Two small
intra-articular bodies in the anterior inferior joint space.

Prior distal clavicle resection and acromioplasty.

Ligaments

Ligaments are suboptimally evaluated by CT.

Muscles and Tendons
Mild supraspinatus and infraspinatus muscle atrophy. Moderate teres
minor muscle atrophy.

Soft tissue
No fluid collection or hematoma. No soft tissue mass. The visualized
right lung is clear.
IMPRESSION: 1. Mild glenohumeral osteoarthritis.
2. Old impaction fracture of the posterosuperior humeral head. No
acute osseous abnormality.
3. Prior rotator cuff and labral repair. Mild supraspinatus and
infraspinatus muscle atrophy. Moderate teres minor muscle atrophy.

## 2022-07-21 DIAGNOSIS — I10 Essential (primary) hypertension: Secondary | ICD-10-CM | POA: Diagnosis not present

## 2022-07-21 DIAGNOSIS — Z0001 Encounter for general adult medical examination with abnormal findings: Secondary | ICD-10-CM | POA: Diagnosis not present

## 2022-07-21 DIAGNOSIS — E039 Hypothyroidism, unspecified: Secondary | ICD-10-CM | POA: Diagnosis not present

## 2022-07-21 DIAGNOSIS — E7849 Other hyperlipidemia: Secondary | ICD-10-CM | POA: Diagnosis not present

## 2022-07-25 DIAGNOSIS — Z0001 Encounter for general adult medical examination with abnormal findings: Secondary | ICD-10-CM | POA: Diagnosis not present

## 2022-07-25 DIAGNOSIS — Z23 Encounter for immunization: Secondary | ICD-10-CM | POA: Diagnosis not present

## 2022-07-25 DIAGNOSIS — M1000 Idiopathic gout, unspecified site: Secondary | ICD-10-CM | POA: Diagnosis not present

## 2022-07-25 DIAGNOSIS — I10 Essential (primary) hypertension: Secondary | ICD-10-CM | POA: Diagnosis not present

## 2022-07-25 DIAGNOSIS — E7849 Other hyperlipidemia: Secondary | ICD-10-CM | POA: Diagnosis not present

## 2022-08-09 DIAGNOSIS — I10 Essential (primary) hypertension: Secondary | ICD-10-CM | POA: Diagnosis not present

## 2022-08-09 DIAGNOSIS — E875 Hyperkalemia: Secondary | ICD-10-CM | POA: Diagnosis not present

## 2022-09-26 IMAGING — DX DG SHOULDER 1V*R*
1 series · 1 of 1 positions shown · non-contrast
Comparison: 03/18/2021

CLINICAL DATA: Status post reverse total shoulder arthroplasty.

EXAM:
RIGHT SHOULDER - 1 VIEW

[shoulder ap]
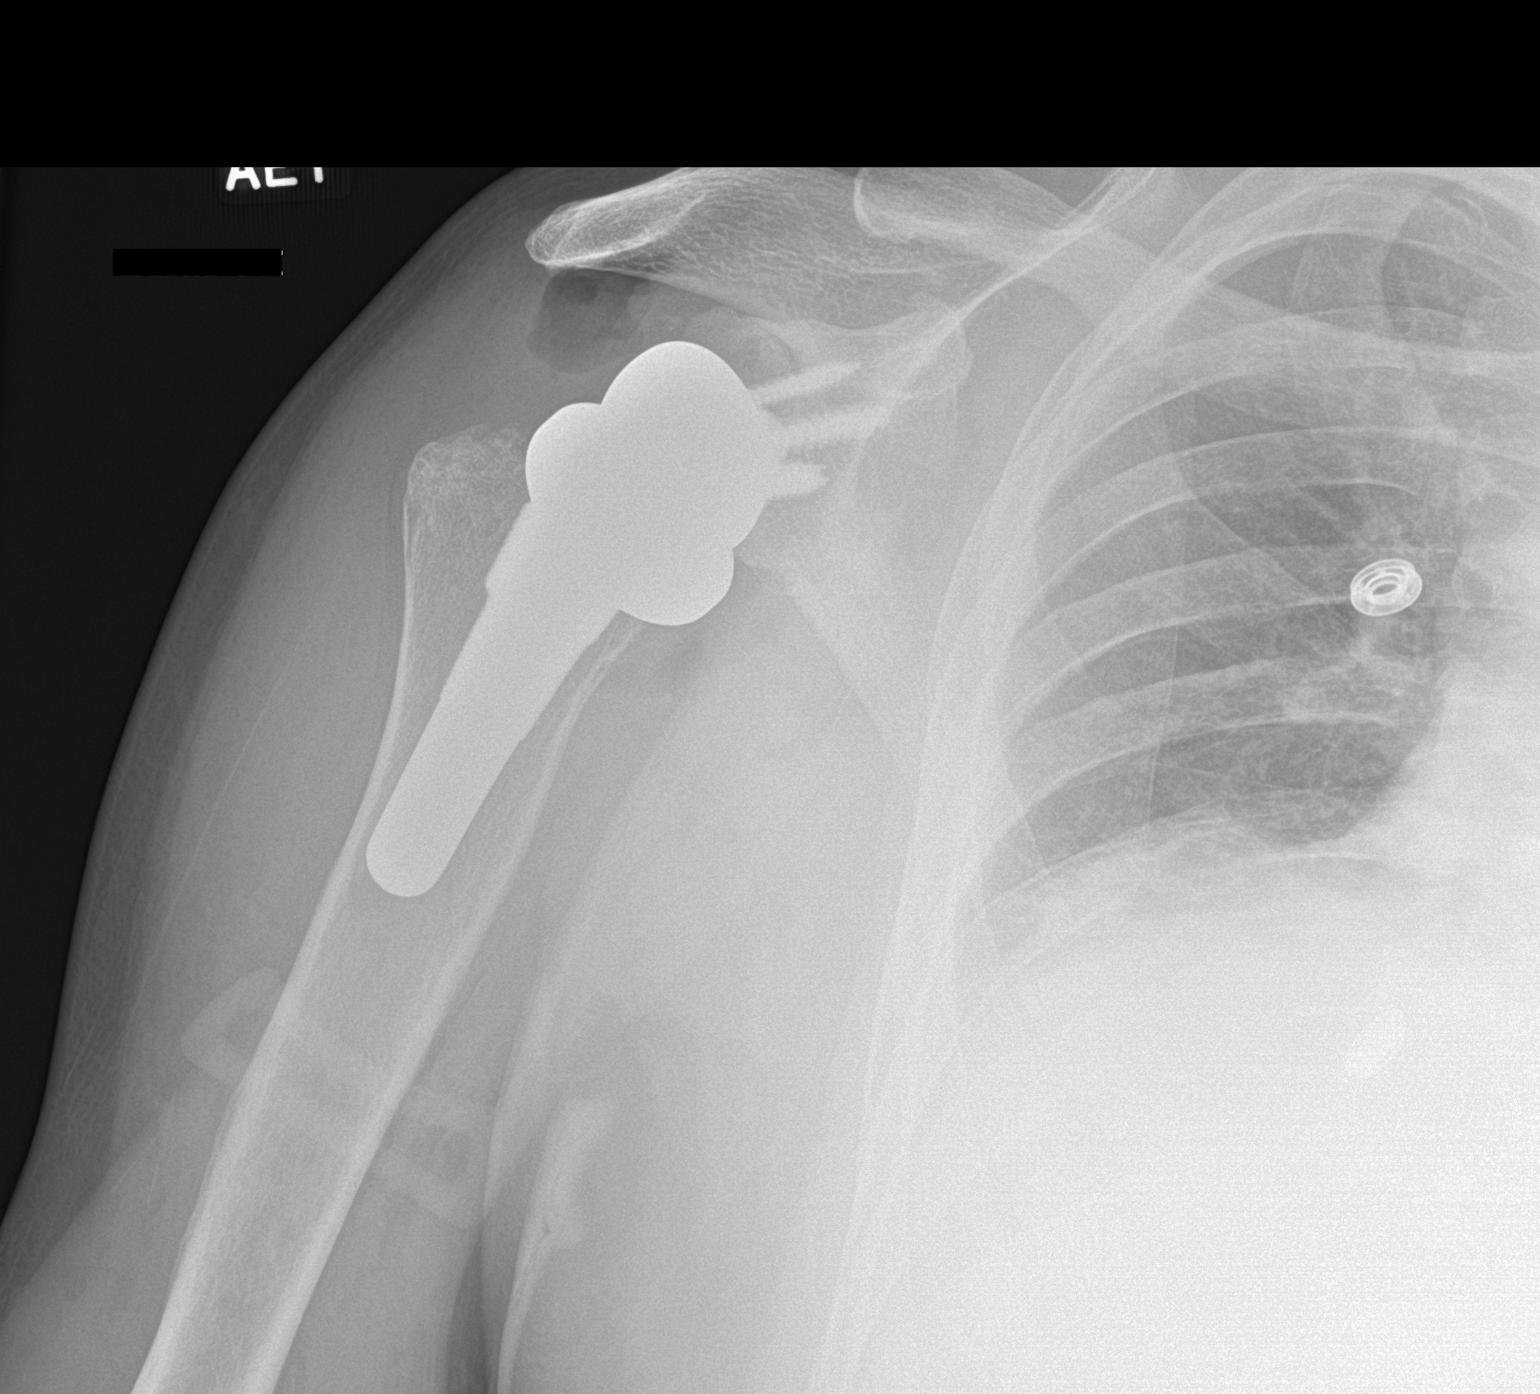

[1 of 1 positions shown; findings below may reference images not displayed]

FINDINGS: Single view shows reverse total arthroplasty of the right shoulder.
Components appear well positioned. No radiographically detectable
complication.
IMPRESSION: Reverse total arthroplasty of the right shoulder.

## 2023-01-17 DIAGNOSIS — I10 Essential (primary) hypertension: Secondary | ICD-10-CM | POA: Diagnosis not present

## 2023-01-17 DIAGNOSIS — E7849 Other hyperlipidemia: Secondary | ICD-10-CM | POA: Diagnosis not present

## 2023-01-24 DIAGNOSIS — D692 Other nonthrombocytopenic purpura: Secondary | ICD-10-CM | POA: Diagnosis not present

## 2023-01-24 DIAGNOSIS — Z23 Encounter for immunization: Secondary | ICD-10-CM | POA: Diagnosis not present

## 2023-01-24 DIAGNOSIS — I10 Essential (primary) hypertension: Secondary | ICD-10-CM | POA: Diagnosis not present

## 2023-01-24 DIAGNOSIS — M1000 Idiopathic gout, unspecified site: Secondary | ICD-10-CM | POA: Diagnosis not present

## 2023-01-24 DIAGNOSIS — E7849 Other hyperlipidemia: Secondary | ICD-10-CM | POA: Diagnosis not present

## 2023-01-24 DIAGNOSIS — Z0001 Encounter for general adult medical examination with abnormal findings: Secondary | ICD-10-CM | POA: Diagnosis not present

## 2023-02-09 DIAGNOSIS — E875 Hyperkalemia: Secondary | ICD-10-CM | POA: Diagnosis not present

## 2023-02-19 DIAGNOSIS — N189 Chronic kidney disease, unspecified: Secondary | ICD-10-CM | POA: Diagnosis not present

## 2023-03-21 DIAGNOSIS — R338 Other retention of urine: Secondary | ICD-10-CM | POA: Diagnosis not present

## 2023-03-21 DIAGNOSIS — N1832 Chronic kidney disease, stage 3b: Secondary | ICD-10-CM | POA: Diagnosis not present

## 2023-03-22 DIAGNOSIS — R338 Other retention of urine: Secondary | ICD-10-CM | POA: Diagnosis not present

## 2023-04-13 DIAGNOSIS — R338 Other retention of urine: Secondary | ICD-10-CM | POA: Diagnosis not present

## 2023-04-13 DIAGNOSIS — N1832 Chronic kidney disease, stage 3b: Secondary | ICD-10-CM | POA: Diagnosis not present

## 2023-05-09 DIAGNOSIS — N1832 Chronic kidney disease, stage 3b: Secondary | ICD-10-CM | POA: Diagnosis not present

## 2023-05-09 DIAGNOSIS — R338 Other retention of urine: Secondary | ICD-10-CM | POA: Diagnosis not present

## 2023-06-01 DIAGNOSIS — R338 Other retention of urine: Secondary | ICD-10-CM | POA: Diagnosis not present

## 2023-06-01 DIAGNOSIS — N1832 Chronic kidney disease, stage 3b: Secondary | ICD-10-CM | POA: Diagnosis not present

## 2023-06-29 DIAGNOSIS — R338 Other retention of urine: Secondary | ICD-10-CM | POA: Diagnosis not present

## 2023-07-13 DIAGNOSIS — R338 Other retention of urine: Secondary | ICD-10-CM | POA: Diagnosis not present

## 2023-08-01 DIAGNOSIS — E559 Vitamin D deficiency, unspecified: Secondary | ICD-10-CM | POA: Diagnosis not present

## 2023-08-01 DIAGNOSIS — N189 Chronic kidney disease, unspecified: Secondary | ICD-10-CM | POA: Diagnosis not present

## 2023-08-01 DIAGNOSIS — E7849 Other hyperlipidemia: Secondary | ICD-10-CM | POA: Diagnosis not present

## 2023-08-01 DIAGNOSIS — N139 Obstructive and reflux uropathy, unspecified: Secondary | ICD-10-CM | POA: Diagnosis not present

## 2023-08-01 DIAGNOSIS — N1832 Chronic kidney disease, stage 3b: Secondary | ICD-10-CM | POA: Diagnosis not present

## 2023-08-01 DIAGNOSIS — Z1321 Encounter for screening for nutritional disorder: Secondary | ICD-10-CM | POA: Diagnosis not present

## 2023-08-01 DIAGNOSIS — N320 Bladder-neck obstruction: Secondary | ICD-10-CM | POA: Diagnosis not present

## 2023-08-01 DIAGNOSIS — E785 Hyperlipidemia, unspecified: Secondary | ICD-10-CM | POA: Diagnosis not present

## 2023-08-01 DIAGNOSIS — M109 Gout, unspecified: Secondary | ICD-10-CM | POA: Diagnosis not present

## 2023-08-01 DIAGNOSIS — Z1329 Encounter for screening for other suspected endocrine disorder: Secondary | ICD-10-CM | POA: Diagnosis not present

## 2023-08-01 DIAGNOSIS — I10 Essential (primary) hypertension: Secondary | ICD-10-CM | POA: Diagnosis not present

## 2023-08-01 DIAGNOSIS — E875 Hyperkalemia: Secondary | ICD-10-CM | POA: Diagnosis not present

## 2023-08-08 DIAGNOSIS — E7849 Other hyperlipidemia: Secondary | ICD-10-CM | POA: Diagnosis not present

## 2023-08-08 DIAGNOSIS — Z0001 Encounter for general adult medical examination with abnormal findings: Secondary | ICD-10-CM | POA: Diagnosis not present

## 2023-08-08 DIAGNOSIS — M1000 Idiopathic gout, unspecified site: Secondary | ICD-10-CM | POA: Diagnosis not present

## 2023-08-08 DIAGNOSIS — E782 Mixed hyperlipidemia: Secondary | ICD-10-CM | POA: Diagnosis not present

## 2023-08-08 DIAGNOSIS — I10 Essential (primary) hypertension: Secondary | ICD-10-CM | POA: Diagnosis not present

## 2023-08-10 DIAGNOSIS — R338 Other retention of urine: Secondary | ICD-10-CM | POA: Diagnosis not present

## 2023-08-22 ENCOUNTER — Other Ambulatory Visit: Payer: Self-pay | Admitting: Urology

## 2023-08-31 ENCOUNTER — Encounter (HOSPITAL_COMMUNITY): Payer: Self-pay

## 2023-08-31 NOTE — Patient Instructions (Signed)
 SURGICAL WAITING ROOM VISITATION  Patients having surgery or a procedure may have no more than 2 support people in the waiting area - these visitors may rotate.    Children under the age of 23 must have an adult with them who is not the patient.  Due to an increase in RSV and influenza rates and associated hospitalizations, children ages 23 and under may not visit patients in [facility] hospitals.  Visitors with respiratory illnesses are discouraged from visiting and should remain at home.  If the patient needs to stay at the hospital during part of their recovery, the visitor guidelines for inpatient rooms apply. Pre-op nurse will coordinate an appropriate time for 1 support person to accompany patient in pre-op.  This support person may not rotate.    Please refer to the [facility] website for the visitor guidelines for Inpatients (after your surgery is over and you are in a regular room).       Your procedure is scheduled on: 09-13-23   Report to [facility] Main Entrance    Report to admitting at      0745  AM   Call this number if you have problems the morning of surgery [phone]   Do not eat food or drink liquids :After Midnight.                If you have questions, please contact your surgeon's office.   FOLLOW BOWEL PREP AND ANY ADDITIONAL PRE OP INSTRUCTIONS YOU RECEIVED FROM YOUR SURGEON'S OFFICE!!!     Oral Hygiene is also important to reduce your risk of infection.                                    Remember - BRUSH YOUR TEETH THE MORNING OF SURGERY WITH YOUR REGULAR TOOTHPASTE  DENTURES WILL BE REMOVED PRIOR TO SURGERY PLEASE DO NOT APPLY "Poly grip" OR ADHESIVES!!!   Do NOT smoke after Midnight   Stop all vitamins and herbal supplements 7 days before surgery.   Take these medicines the morning of surgery with A SIP OF WATER: eye drops as usual, finasteride, atorvastatin    Bring CPAP mask and tubing day of surgery.                               You may not have any metal on your body including hair pins, jewelry, and body piercing             Do not wear  lotions, powders, perfumes/cologne, or deodorant                 Men may shave face and neck.   Do not bring valuables to the hospital. [facility] IS NOT             RESPONSIBLE   FOR VALUABLES.   Contacts, glasses, dentures or bridgework may not be worn into surgery.   Bring small overnight bag day of surgery.   DO NOT BRING YOUR HOME MEDICATIONS TO THE HOSPITAL. PHARMACY WILL DISPENSE MEDICATIONS LISTED ON YOUR MEDICATION LIST TO YOU DURING YOUR ADMISSION IN THE HOSPITAL!    Patients discharged on the day of surgery will not be allowed to drive home.  Someone NEEDS to stay with you for the first 24 hours after anesthesia.   Special Instructions: Bring a copy of your  healthcare power of attorney and living will documents the day of surgery if you haven't scanned them before.              Please read over the following fact sheets you were given: IF YOU HAVE QUESTIONS ABOUT YOUR PRE-OP INSTRUCTIONS PLEASE CALL [phone]    If you test positive for Covid or have been in contact with anyone that has tested positive in the last 10 days please notify you surgeon.    [facility] - Preparing for Surgery Before surgery, you can play an important role.  Because skin is not sterile, your skin needs to be as free of germs as possible.  You can reduce the number of germs on your skin by washing with CHG (chlorahexidine gluconate) soap before surgery.  CHG is an antiseptic cleaner which kills germs and bonds with the skin to continue killing germs even after washing. Please DO NOT use if you have an allergy to CHG or antibacterial soaps.  If your skin becomes reddened/irritated stop using the CHG and inform your nurse when you arrive at Short Stay. Do not shave (including legs and underarms) for at least 48 hours prior to the first CHG shower.  You may shave your  face/neck. Please follow these instructions carefully:  1.  Shower with CHG Soap the night before surgery and the  morning of Surgery.  2.  If you choose to wash your hair, wash your hair first as usual with your  normal  shampoo.  3.  After you shampoo, rinse your hair and body thoroughly to remove the  shampoo.                           4.  Use CHG as you would any other liquid soap.  You can apply chg directly  to the skin and wash                       Gently with a scrungie or clean washcloth.  5.  Apply the CHG Soap to your body ONLY FROM THE NECK DOWN.   Do not use on face/ open                           Wound or open sores. Avoid contact with eyes, ears mouth and genitals (private parts).                       Wash face,  Genitals (private parts) with your normal soap.             6.  Wash thoroughly, paying special attention to the area where your surgery  will be performed.  7.  Thoroughly rinse your body with warm water from the neck down.  8.  DO NOT shower/wash with your normal soap after using and rinsing off  the CHG Soap.                9.  Pat yourself dry with a clean towel.            10.  Wear clean pajamas.            11.  Place clean sheets on your bed the night of your first shower and do not  sleep with pets. Day of Surgery : Do not apply any lotions/deodorants the morning of surgery.  Please wear clean  clothes to the hospital/surgery center.  FAILURE TO FOLLOW THESE INSTRUCTIONS MAY RESULT IN THE CANCELLATION OF YOUR SURGERY PATIENT SIGNATURE_________________________________  NURSE SIGNATURE__________________________________  ________________________________________________________________________

## 2023-08-31 NOTE — Progress Notes (Signed)
 PCP - Walter Gunning, MD Cardiologist -   PPM/ICD -  Device Orders -  Rep Notified -   Chest x-ray -  EKG -  Stress Test -  ECHO -  Cardiac Cath -   Sleep Study -  CPAP -   Fasting Blood Sugar -  Checks Blood Sugar _____ times a day  Blood Thinner Instructions: Aspirin Instructions:  ERAS Protcol - PRE-SURGERY N/A   COVID vaccine -  Activity-- Anesthesia review: CKD stage 3  Patient denies shortness of breath, fever, cough and chest pain at PAT appointment   All instructions explained to the patient, with a verbal understanding of the material. Patient agrees to go over the instructions while at home for a better understanding. Patient also instructed to self quarantine after being tested for COVID-19. The opportunity to ask questions was provided.

## 2023-09-03 ENCOUNTER — Encounter (HOSPITAL_COMMUNITY): Payer: Self-pay

## 2023-09-03 ENCOUNTER — Other Ambulatory Visit: Payer: Self-pay

## 2023-09-03 ENCOUNTER — Encounter (HOSPITAL_COMMUNITY)
Admission: RE | Admit: 2023-09-03 | Discharge: 2023-09-03 | Disposition: A | Discharge status: Home / Self Care | Source: Ambulatory Visit | Attending: Urology | Admitting: Urology

## 2023-09-03 VITALS — BP 130/84 | HR 76 | Temp 98.2°F | Resp 16 | Ht 68.0 in | Wt 182.0 lb

## 2023-09-03 DIAGNOSIS — I10 Essential (primary) hypertension: Secondary | ICD-10-CM | POA: Diagnosis not present

## 2023-09-03 DIAGNOSIS — R9431 Abnormal electrocardiogram [ECG] [EKG]: Secondary | ICD-10-CM | POA: Insufficient documentation

## 2023-09-03 DIAGNOSIS — Z01818 Encounter for other preprocedural examination: Secondary | ICD-10-CM | POA: Insufficient documentation

## 2023-09-03 LAB — BASIC METABOLIC PANEL WITH GFR
Anion gap: 7 (ref 5–15)
BUN: 11 mg/dL (ref 8–23)
CO2: 25 mmol/L (ref 22–32)
Calcium: 8.9 mg/dL (ref 8.9–10.3)
Chloride: 100 mmol/L (ref 98–111)
Creatinine, Ser: 1.25 mg/dL — ABNORMAL HIGH (ref 0.61–1.24)
GFR, Estimated: >60 mL/min (ref >60)
Glucose, Bld: 98 mg/dL (ref 70–99)
Potassium: 4.6 mmol/L (ref 3.5–5.1)
Sodium: 132 mmol/L — ABNORMAL LOW (ref 135–145)

## 2023-09-03 LAB — CBC WITH DIFFERENTIAL/PLATELET
Abs Immature Granulocytes: 0.04 10*3/uL (ref 0.00–0.07)
Basophils Absolute: 0 10*3/uL (ref 0.0–0.1)
Basophils Relative: 0 %
Eosinophils Absolute: 0.1 10*3/uL (ref 0.0–0.5)
Eosinophils Relative: 1 %
HCT: 41.8 % (ref 39.0–52.0)
Hemoglobin: 13.7 g/dL (ref 13.0–17.0)
Immature Granulocytes: 0 %
Lymphocytes Relative: 19 %
Lymphs Abs: 1.8 10*3/uL (ref 0.7–4.0)
MCH: 30.6 pg (ref 26.0–34.0)
MCHC: 32.8 g/dL (ref 30.0–36.0)
MCV: 93.3 fL (ref 80.0–100.0)
Monocytes Absolute: 0.7 10*3/uL (ref 0.1–1.0)
Monocytes Relative: 7 %
Neutro Abs: 7 10*3/uL (ref 1.7–7.7)
Neutrophils Relative %: 73 %
Platelets: 273 10*3/uL (ref 150–400)
RBC: 4.48 MIL/uL (ref 4.22–5.81)
RDW: 12.9 % (ref 11.5–15.5)
WBC: 9.6 10*3/uL (ref 4.0–10.5)
nRBC: 0 % (ref 0.0–0.2)

## 2023-09-12 NOTE — H&P (Signed)
 68 year old male with history of BPH currently using Flomax recent chemistry demonstrates a creatinine of 2.03 with a GFR of 34 ultrasound demonstrates moderate bilateral hydronephrosis. Referred for urinary retention.  Found to have large prostate but functional bladder.   PMH: Hypertension, depression,   BPH w/ Retention:  03/2023: Records demonstrated creatinine of 2, patient denies suprapubic pain his PVR today is greater than the thousand he has high AUA symptom score of 32. Patient is on tamsulosin 0.4 mg daily we discussed the need for bladder drainage. Patient is agreeable we discussed CIC versus indwelling patient for CIC   Ultrasound today demonstrates bilateral hydronephrosis and a bladder volume of 1.2 L   04/13/2023: Patient is have the catheter in for 3 weeks now states that overall it is improved his sleep as well as driving he is actually pleased with how the catheter has improved this aspect of his life. He presents with his wife he complains of fatigue patient states he is not been as active as he usually is. Denies nausea vomiting fevers or chills. DRE today demonstrates a very large prostate no firm nodules.  05/09/23:f/u prostate US , and BMP with cystoscopy, no prostate ultrasound done today, so referred to nephrologist. Cystoscopy today demonstrates a very large prostate with 16 m prostatic urethra large intravesical lobe. Discussed CIC versus indwelling patient would prefer to try indwelling for the time being.  06/01/23: Cysto shows 6-7 cm PU, renal US  and prostate US , need UDS not been ordered, discussion of CIC. Prostate US  46g, Renal US  shows no hydro, small focus L kiendy will need CT in future, Prostate 46 grams  08/10/22: UDS shows unstable contractions and good detrussor function going up to 57 in an attempt to void. PRostate volume 46 grams. DIscussed options of management including TURP, Aquaablaiton and TUP. Pt wants to purse TURP.  09/13/23: here today fro aqua ablation     Left renal focus:  06/01/23: seen on US  today will need CT in future     ALLERGIES: No Allergies    MEDICATIONS: Finasteride 5 MG Tablet 1 tablet PO Daily  Citalopram Hydrobromide 20 MG Tablet 1 tablet PO Daily  Olmesartan Medoxomil 20 MG Tablet 1 tablet PO Daily  Tamsulosin HCl 0.4 MG Capsule 1 capsule PO Daily     GU PSH: Complex cystometrogram, w/ void pressure and urethral pressure profile studies, any technique - 07/13/2023 Complex Uroflow - 07/13/2023 Cystoscopy - 05/09/2023 Emg surf Electrd - 07/13/2023 Inject For cystogram - 07/13/2023 Intrabd voidng Press - 07/13/2023     NON-GU PSH: Shoulder Arthroscopy/surgery, Right Visit Complexity (formerly GPC1X) - 06/01/2023, 05/09/2023, 04/13/2023         GU PMH: Urinary Retention - 07/13/2023, - 06/29/2023, - 06/01/2023, - 05/09/2023, - 04/13/2023, - 03/21/2023 BPH w/LUTS - 06/01/2023, - 05/09/2023, - 03/21/2023    NON-GU PMH: Chronic kidney disease, stage 3b - 06/01/2023, - 05/09/2023, - 04/13/2023, - 03/21/2023 Asthma Depression GERD Gout Hypertension    FAMILY HISTORY: No Family History    SOCIAL HISTORY: Marital Status: Married Ethnicity: Not Hispanic Or Latino; Race: White Current Smoking Status: Patient does not smoke anymore.  <DIV'  Tobacco Use Assessment Completed:  Used Tobacco in last 30 days? Does drink.  Drinks 3 caffeinated drinks per day. </DIV'   REVIEW OF SYSTEMS:     GU Review Male:  Patient denies frequent urination, hard to postpone urination, burning/ pain with urination, get up at night to urinate, leakage of urine, stream starts and stops, trouble starting your stream,  have to strain to urinate , erection problems, and penile pain.    Gastrointestinal (Upper):  Patient denies nausea, vomiting, and indigestion/ heartburn.    Gastrointestinal (Lower):  Patient denies diarrhea and constipation.    Constitutional:  Patient denies fever, night sweats, weight loss, and fatigue.    Skin:  Patient denies itching and  skin rash/ lesion.    Eyes:  Patient denies blurred vision and double vision.    Ears/ Nose/ Throat:  Patient denies sore throat and sinus problems.    Hematologic/Lymphatic:  Patient denies swollen glands and easy bruising.    Cardiovascular:  Patient denies leg swelling and chest pains.    Respiratory:  Patient denies cough and shortness of breath.    Endocrine:  Patient denies excessive thirst.    Musculoskeletal:  Patient denies back pain and joint pain.    Neurological:  Patient denies headaches and dizziness.    Psychologic:  Patient denies depression and anxiety.    VITAL SIGNS: None     GU PHYSICAL EXAMINATION:      Penis: Catheter in place draining clear yellow urine.     MULTI-SYSTEM PHYSICAL EXAMINATION:                    Complexity of Data:   Source Of History:  Patient  Records Review:  Previous Patient Records  Urine Test Review:  Urinalysis  Urodynamics Review:  Review Urodynamics Tests   PROCEDURES:    Simple Foley Indwelling Cath Change - 51702  The patient's indwelling foley tube was carefully removed. A 16 French Foley catheter was inserted into the bladder using sterile technique. The patient was taught routine catheter care. Hand irrigation of the bladder with sterile water was performed. A leg bag was connected.    Visit Complexity - G2211      Urinalysis w/Scope  Dipstick Dipstick Cont'd Micro  Color: Yellow Bilirubin: Neg mg/dL WBC/hpf: 20 - 95/AOZ  Appearance: Slightly Cloudy Ketones: Neg mg/dL RBC/hpf: 3 - 30/QMV  Specific Gravity: 1.010 Blood: 2+ ery/uL Bacteria: Many (>50/hpf)  pH: 6.5 Protein: Trace mg/dL Cystals: Amorph Phosphates  Glucose: Neg mg/dL Urobilinogen: 0.2 mg/dL Casts: NS (Not Seen)   Nitrites: Positive Trichomonas: Not Present   Leukocyte Esterase: 3+ leu/uL Mucous: Present    Epithelial Cells: 0 - 5/hpf    Yeast: NS (Not Seen)    Sperm: Not Present    ASSESSMENT:     ICD-10 Details  1 GU:  BPH w/LUTS - N40.1 Chronic,  Stable  2  Urinary Retention - R33.8 Chronic, Stable   PLAN:   Orders  Labs Urine Culture  Schedule  Return Visit/Planned Activity: 1 Month - Nurse Visit  Note: catheter change   Document  Letter(s):  Created for Patient: Clinical Summary   Notes:  Urinary retention: BPH shows prostate 46 g, UDS demonstrates good bladder function because of this feel like bladder outlet surgery will be beneficial we offered Rezum, TURP, aqua ablation. After thorough discussion the risk benefits alternatives to all 3 patient would prefer to do aqua ablation. Here today for Aquaablation.   Plan for aquaablation today  UCX was positive sent keflex prior to procedure.   We discussed risk benefits alternatives to Aquaablation. Benefits discussed were improvement in urination symptoms. We also discussed risk which included failure to completely resolve lower urinary tract symptoms, bleeding requiring transfusion, infection, and damage to surrounding structures. We also discussed risk of stricture formation, bladder neck contracture, prostatic regrowth, retrograde ejaculation, and discovery  of cancer with the pathology. The need for catheter postoperatively was discussed as well. The patient voices understanding and would like to proceed with the procedure.

## 2023-09-13 ENCOUNTER — Ambulatory Visit (HOSPITAL_COMMUNITY)
Admission: RE | Admit: 2023-09-13 | Discharge: 2023-09-14 | Disposition: A | Discharge status: Home / Self Care | Source: Ambulatory Visit | Attending: Urology | Admitting: Urology

## 2023-09-13 ENCOUNTER — Encounter (HOSPITAL_COMMUNITY)
Admission: RE | Disposition: A | Discharge status: Home / Self Care | Payer: Self-pay | Source: Ambulatory Visit | Attending: Urology

## 2023-09-13 ENCOUNTER — Ambulatory Visit (HOSPITAL_COMMUNITY): Admitting: Anesthesiology

## 2023-09-13 ENCOUNTER — Ambulatory Visit (HOSPITAL_BASED_OUTPATIENT_CLINIC_OR_DEPARTMENT_OTHER): Admitting: Anesthesiology

## 2023-09-13 ENCOUNTER — Other Ambulatory Visit: Payer: Self-pay

## 2023-09-13 ENCOUNTER — Encounter (HOSPITAL_COMMUNITY): Payer: Self-pay | Admitting: Urology

## 2023-09-13 DIAGNOSIS — M1990 Unspecified osteoarthritis, unspecified site: Secondary | ICD-10-CM | POA: Insufficient documentation

## 2023-09-13 DIAGNOSIS — R338 Other retention of urine: Secondary | ICD-10-CM | POA: Insufficient documentation

## 2023-09-13 DIAGNOSIS — N1832 Chronic kidney disease, stage 3b: Secondary | ICD-10-CM | POA: Insufficient documentation

## 2023-09-13 DIAGNOSIS — I129 Hypertensive chronic kidney disease with stage 1 through stage 4 chronic kidney disease, or unspecified chronic kidney disease: Secondary | ICD-10-CM | POA: Insufficient documentation

## 2023-09-13 DIAGNOSIS — Z79899 Other long term (current) drug therapy: Secondary | ICD-10-CM | POA: Insufficient documentation

## 2023-09-13 DIAGNOSIS — N400 Enlarged prostate without lower urinary tract symptoms: Secondary | ICD-10-CM | POA: Diagnosis present

## 2023-09-13 DIAGNOSIS — N401 Enlarged prostate with lower urinary tract symptoms: Secondary | ICD-10-CM

## 2023-09-13 DIAGNOSIS — N179 Acute kidney failure, unspecified: Secondary | ICD-10-CM | POA: Diagnosis not present

## 2023-09-13 DIAGNOSIS — Z87891 Personal history of nicotine dependence: Secondary | ICD-10-CM | POA: Diagnosis not present

## 2023-09-13 SURGERY — ABLATION, PROSTATE, TRANSURETHRAL, USING WATERJET
Anesthesia: General

## 2023-09-13 MED ORDER — SUGAMMADEX SODIUM 200 MG/2ML IV SOLN
INTRAVENOUS | Status: DC | PRN
  Administered 2023-09-13: 200 mg via INTRAVENOUS

## 2023-09-13 MED ORDER — ACETAMINOPHEN 500 MG PO TABS
1000.0000 mg | ORAL_TABLET | Freq: Four times a day (QID) | ORAL | Status: DC
  Administered 2023-09-13 – 2023-09-14 (×3): 1000 mg via ORAL
  Filled 2023-09-13 (×3): qty 2

## 2023-09-13 MED ORDER — LIDOCAINE HCL (PF) 2 % IJ SOLN
INTRAMUSCULAR | Status: AC
  Filled 2023-09-13: qty 5

## 2023-09-13 MED ORDER — DEXAMETHASONE SODIUM PHOSPHATE 10 MG/ML IJ SOLN
INTRAMUSCULAR | Status: AC
  Filled 2023-09-13: qty 1

## 2023-09-13 MED ORDER — CHLORHEXIDINE GLUCONATE 0.12 % MT SOLN
15.0000 mL | Freq: Once | OROMUCOSAL | Status: AC
  Administered 2023-09-13: 15 mL via OROMUCOSAL

## 2023-09-13 MED ORDER — 0.9 % SODIUM CHLORIDE (POUR BTL) OPTIME
TOPICAL | Status: DC | PRN
  Administered 2023-09-13: 1000 mL

## 2023-09-13 MED ORDER — FENTANYL CITRATE PF 50 MCG/ML IJ SOSY: 25.0000 ug | PREFILLED_SYRINGE | INTRAMUSCULAR | Status: DC | PRN

## 2023-09-13 MED ORDER — ONDANSETRON HCL 4 MG/2ML IJ SOLN: 4.0000 mg | INTRAMUSCULAR | Status: DC | PRN

## 2023-09-13 MED ORDER — DIPHENHYDRAMINE HCL 12.5 MG/5ML PO ELIX
12.5000 mg | ORAL_SOLUTION | Freq: Four times a day (QID) | ORAL | Status: DC | PRN
  Administered 2023-09-13: 12.5 mg via ORAL
  Filled 2023-09-13: qty 5

## 2023-09-13 MED ORDER — ROCURONIUM BROMIDE 10 MG/ML (PF) SYRINGE
PREFILLED_SYRINGE | INTRAVENOUS | Status: AC
Start: 2023-09-13 — End: ?
  Filled 2023-09-13: qty 10

## 2023-09-13 MED ORDER — ORAL CARE MOUTH RINSE: 15.0000 mL | Freq: Once | OROMUCOSAL | Status: AC

## 2023-09-13 MED ORDER — METHOCARBAMOL 750 MG PO TABS: 750.0000 mg | ORAL_TABLET | Freq: Four times a day (QID) | ORAL | 0 refills | Status: AC

## 2023-09-13 MED ORDER — ONDANSETRON HCL 4 MG/2ML IJ SOLN: 4.0000 mg | Freq: Once | INTRAMUSCULAR | Status: DC | PRN

## 2023-09-13 MED ORDER — ATORVASTATIN CALCIUM 40 MG PO TABS
40.0000 mg | ORAL_TABLET | Freq: Every day | ORAL | Status: DC
Start: 2023-09-13 — End: 2023-09-14
  Administered 2023-09-13 – 2023-09-14 (×2): 40 mg via ORAL
  Filled 2023-09-13 (×2): qty 1

## 2023-09-13 MED ORDER — DIPHENHYDRAMINE HCL 50 MG/ML IJ SOLN: 12.5000 mg | Freq: Four times a day (QID) | INTRAMUSCULAR | Status: DC | PRN

## 2023-09-13 MED ORDER — LACTATED RINGERS IV SOLN: INTRAVENOUS | Status: DC

## 2023-09-13 MED ORDER — SODIUM CHLORIDE 0.9 % IR SOLN
3000.0000 mL | Status: DC
  Administered 2023-09-13 (×2): 3000 mL

## 2023-09-13 MED ORDER — FENTANYL CITRATE (PF) 100 MCG/2ML IJ SOLN
INTRAMUSCULAR | Status: AC
  Filled 2023-09-13: qty 2

## 2023-09-13 MED ORDER — POLYETHYLENE GLYCOL 3350 17 G PO PACK: 17.0000 g | PACK | Freq: Every day | ORAL | Status: DC | PRN

## 2023-09-13 MED ORDER — EPHEDRINE SULFATE-NACL 50-0.9 MG/10ML-% IV SOSY
PREFILLED_SYRINGE | INTRAVENOUS | Status: DC | PRN
  Administered 2023-09-13: 10 mg via INTRAVENOUS
  Administered 2023-09-13 (×2): 5 mg via INTRAVENOUS

## 2023-09-13 MED ORDER — SENNOSIDES-DOCUSATE SODIUM 8.6-50 MG PO TABS
2.0000 | ORAL_TABLET | Freq: Every day | ORAL | Status: DC
  Administered 2023-09-13: 2 via ORAL
  Filled 2023-09-13: qty 2

## 2023-09-13 MED ORDER — ONDANSETRON HCL 4 MG/2ML IJ SOLN
INTRAMUSCULAR | Status: DC | PRN
  Administered 2023-09-13: 4 mg via INTRAVENOUS

## 2023-09-13 MED ORDER — ACETAMINOPHEN 10 MG/ML IV SOLN: 1000.0000 mg | Freq: Once | INTRAVENOUS | Status: DC | PRN

## 2023-09-13 MED ORDER — DEXAMETHASONE SODIUM PHOSPHATE 10 MG/ML IJ SOLN
INTRAMUSCULAR | Status: DC | PRN
  Administered 2023-09-13: 10 mg via INTRAVENOUS

## 2023-09-13 MED ORDER — POLYETHYLENE GLYCOL 3350 17 G PO PACK: 17.0000 g | PACK | Freq: Every day | ORAL | 0 refills | Status: AC

## 2023-09-13 MED ORDER — NAPHAZOLINE-PHENIRAMINE 0.025-0.3 % OP SOLN
1.0000 [drp] | Freq: Every day | OPHTHALMIC | Status: DC
  Administered 2023-09-14: 1 [drp] via OPHTHALMIC
  Filled 2023-09-13: qty 15

## 2023-09-13 MED ORDER — ROCURONIUM BROMIDE 100 MG/10ML IV SOLN
INTRAVENOUS | Status: DC | PRN
  Administered 2023-09-13: 60 mg via INTRAVENOUS
  Administered 2023-09-13: 10 mg via INTRAVENOUS

## 2023-09-13 MED ORDER — DEXMEDETOMIDINE HCL IN NACL 80 MCG/20ML IV SOLN
INTRAVENOUS | Status: DC | PRN
  Administered 2023-09-13: 8 ug via INTRAVENOUS

## 2023-09-13 MED ORDER — TRANEXAMIC ACID-NACL 1000-0.7 MG/100ML-% IV SOLN
INTRAVENOUS | Status: AC
Start: 2023-09-13 — End: 2023-09-13
  Filled 2023-09-13: qty 100

## 2023-09-13 MED ORDER — FINASTERIDE 5 MG PO TABS
5.0000 mg | ORAL_TABLET | Freq: Every day | ORAL | Status: DC
  Administered 2023-09-13 – 2023-09-14 (×2): 5 mg via ORAL
  Filled 2023-09-13 (×2): qty 1

## 2023-09-13 MED ORDER — STERILE WATER FOR IRRIGATION IR SOLN
Status: DC | PRN
  Administered 2023-09-13: 500 mL

## 2023-09-13 MED ORDER — DEXTROSE-SODIUM CHLORIDE 5-0.45 % IV SOLN: INTRAVENOUS | Status: DC

## 2023-09-13 MED ORDER — ONDANSETRON HCL 4 MG/2ML IJ SOLN
INTRAMUSCULAR | Status: AC
  Filled 2023-09-13: qty 2

## 2023-09-13 MED ORDER — GENTAMICIN SULFATE 40 MG/ML IJ SOLN
3.0000 mg/kg | Freq: Once | INTRAVENOUS | Status: DC
  Filled 2023-09-13: qty 6.25

## 2023-09-13 MED ORDER — TRANEXAMIC ACID-NACL 1000-0.7 MG/100ML-% IV SOLN
1000.0000 mg | INTRAVENOUS | Status: AC
  Administered 2023-09-13: 1000 mg via INTRAVENOUS

## 2023-09-13 MED ORDER — LIDOCAINE HCL (CARDIAC) PF 100 MG/5ML IV SOSY
PREFILLED_SYRINGE | INTRAVENOUS | Status: DC | PRN
  Administered 2023-09-13: 80 mg via INTRAVENOUS

## 2023-09-13 MED ORDER — GENTAMICIN SULFATE 40 MG/ML IJ SOLN
3.0000 mg/kg | Freq: Once | INTRAVENOUS | Status: AC
  Administered 2023-09-13: 248 mg via INTRAVENOUS
  Filled 2023-09-13: qty 6.25

## 2023-09-13 MED ORDER — CITALOPRAM HYDROBROMIDE 20 MG PO TABS
20.0000 mg | ORAL_TABLET | Freq: Every day | ORAL | Status: DC
  Administered 2023-09-13: 20 mg via ORAL
  Filled 2023-09-13: qty 1

## 2023-09-13 MED ORDER — OXYCODONE HCL 5 MG PO TABS: 5.0000 mg | ORAL_TABLET | ORAL | Status: DC | PRN

## 2023-09-13 MED ORDER — FENTANYL CITRATE (PF) 100 MCG/2ML IJ SOLN
INTRAMUSCULAR | Status: DC | PRN
  Administered 2023-09-13 (×3): 50 ug via INTRAVENOUS

## 2023-09-13 MED ORDER — HYOSCYAMINE SULFATE 0.125 MG SL SUBL: 0.1250 mg | SUBLINGUAL_TABLET | SUBLINGUAL | Status: DC | PRN

## 2023-09-13 MED ORDER — CEFAZOLIN SODIUM-DEXTROSE 2-4 GM/100ML-% IV SOLN
2.0000 g | INTRAVENOUS | Status: AC
  Administered 2023-09-13: 2 g via INTRAVENOUS
  Filled 2023-09-13: qty 100

## 2023-09-13 MED ORDER — AMISULPRIDE (ANTIEMETIC) 5 MG/2ML IV SOLN: 10.0000 mg | Freq: Once | INTRAVENOUS | Status: DC | PRN

## 2023-09-13 MED ORDER — SODIUM CHLORIDE 0.9 % IR SOLN
Status: DC | PRN
  Administered 2023-09-13: 9000 mL via INTRAVESICAL
  Administered 2023-09-13: 3000 mL via INTRAVESICAL

## 2023-09-13 MED ORDER — PROPOFOL 10 MG/ML IV BOLUS
INTRAVENOUS | Status: DC | PRN
  Administered 2023-09-13: 200 mg via INTRAVENOUS

## 2023-09-13 SURGICAL SUPPLY — 26 items
BAG URINE DRAIN 2000ML AR STRL (UROLOGICAL SUPPLIES) ×1 IMPLANT
BAND RUBBER #18 3X1/16 STRL (MISCELLANEOUS) IMPLANT
CANISTER SUCT 3000ML PPV (MISCELLANEOUS) ×1 IMPLANT
CATH HEMA 3WAY 30CC 22FR COUDE (CATHETERS) IMPLANT
CATH HEMA 3WAY 30CC 24FR COUDE (CATHETERS) IMPLANT
COVER MAYO STAND STRL (DRAPES) ×1 IMPLANT
DRAPE FOOT SWITCH (DRAPES) ×1 IMPLANT
GEL ULTRASOUND 8.5O AQUASONIC (MISCELLANEOUS) ×1 IMPLANT
GLOVE BIO SURGEON STRL SZ8 (GLOVE) ×1 IMPLANT
GOWN STRL REUS W/ TWL XL LVL3 (GOWN DISPOSABLE) ×1 IMPLANT
HANDPIECE AQUABEAM (MISCELLANEOUS) ×1 IMPLANT
HOLDER FOLEY CATH W/STRAP (MISCELLANEOUS) IMPLANT
KIT TURNOVER KIT A (KITS) IMPLANT
LOOP CUT BIPOLAR 24F LRG (ELECTROSURGICAL) IMPLANT
MANIFOLD NEPTUNE II (INSTRUMENTS) ×1 IMPLANT
MAT ABSORB FLUID 56X50 GRAY (MISCELLANEOUS) ×1 IMPLANT
PACK CYSTO (CUSTOM PROCEDURE TRAY) ×1 IMPLANT
PACK DRAPE AQUABEAM (MISCELLANEOUS) ×1 IMPLANT
PAD PREP 24X48 CUFFED NSTRL (MISCELLANEOUS) ×1 IMPLANT
PIN SAFETY STERILE (MISCELLANEOUS) IMPLANT
SYR 30ML LL (SYRINGE) ×1 IMPLANT
SYRINGE TOOMEY IRRIG 70ML (MISCELLANEOUS) ×2 IMPLANT
TOWEL OR 17X26 10 PK STRL BLUE (TOWEL DISPOSABLE) ×1 IMPLANT
TUBING CONNECTING 10 (TUBING) ×2 IMPLANT
TUBING UROLOGY SET (TUBING) ×1 IMPLANT
UNDERPAD 30X36 HEAVY ABSORB (UNDERPADS AND DIAPERS) ×1 IMPLANT

## 2023-09-13 NOTE — Progress Notes (Signed)
 Not need to measure I&O, cont. CBI clear to pink. Per Dr. Cathi Cluster.

## 2023-09-13 NOTE — Discharge Instructions (Signed)
 Discharge Aquaablation   Care After  Refer to this sheet in the next few weeks. These discharge instructions provide you with general information on caring for yourself after you leave the hospital. Your caregiver may also give you specific instructions. Your treatment has been planned according to the most current medical practices available, but unavoidable complications sometimes occur. If you have any problems or questions after discharge, please call your caregiver.  HOME CARE INSTRUCTIONS   Medications You may receive medicine for pain management. As your level of discomfort decreases, adjustments in your pain medicines may be made.  Take all medicines as directed.  You may be given a medicine (antibiotic) to kill germs following surgery. Finish all medicines. Let your caregiver know if you have any side effects or problems from the medicine.  If you are on aspirin, it would be best not to restart the aspirin until the blood in the urine clears Hygiene You can take a shower after surgery.  You should not take a bath while you still have the urethral catheter. Activity You will be encouraged to get out of bed as much as possible and increase your activity level as tolerated.  Spend the first week in and around your home. For 3 weeks, avoid the following:  Straining.  Running.  Strenuous work.  Walks longer than a few blocks.  Riding for extended periods.  Sexual relations.  Do not lift heavy objects (more than 10 pounds) for at least 1 month. When lifting, use your arms instead of your abdominal muscles.  You will be encouraged to walk as tolerated. Do not exert yourself. Increase your activity level slowly. Remember that it is important to keep moving after an operation of any type. This cuts down on the possibility of developing blood clots.  Your caregiver will tell you when you can resume driving and light housework. Discuss this at your first office visit after  discharge. Diet No special diet is ordered after a TURP. However, if you are on a special diet for another medical problem, it should be continued.  Normal fluid intake is usually recommended.  Avoid alcohol  and caffeinated drinks for 2 weeks. They irritate the bladder. Decaffeinated drinks are okay.  Avoid spicy foods.  Bladder Function For the first 3 weeks, empty the bladder whenever you feel a definite desire. Do not try to hold the urine for long periods of time. Urgency with urination can be normal after this surgery. Urinating once or twice a night even after you are healed is not uncommon.  You may see some recurrence of blood in the urine after discharge from the hospital. This usually happens within 2 weeks after the procedure.If this occurs, force fluids again as you did in the hospital and reduce your activity.  Bowel Function You may experience some constipation after surgery. This can be minimized by increasing fluids and fiber in your diet. Drink enough water and fluids to keep your urine clear or pale yellow.  A stool softener may be prescribed for use at home. Do not strain to move your bowels.  If you are requiring increased pain medicine, it is important that you take stool softeners to prevent constipation. This will help to promote proper healing by reducing the need to strain to move your bowels.  Sexual Activity Semen movement in the opposite direction and into the bladder (retrograde ejaculation) may occur. Since the semen passes into the bladder, cloudy urine can occur the first time you urinate after intercourse.  Or, you may not have an ejaculation during erection. Ask your caregiver when you can resume sexual activity. Retrograde ejaculation and reduced semen discharge should not reduce one's pleasure of intercourse.  Postoperative Visit Arrange the date and time of your after surgery visit with your caregiver.  Return to Work After your recovery is complete, you will be  able to return to work and resume all activities. Your caregiver will inform you when you can return to work.    Foley Catheter Care A soft, flexible tube (Foley catheter) may have been placed in your bladder to drain urine and fluid. Follow these instructions: Taking Care of the Catheter Keep the area where the catheter leaves your body clean.  Attach the catheter to the leg so there is no tension on the catheter.  Keep the drainage bag below the level of the bladder, but keep it OFF the floor.  Do not take long soaking baths. Your caregiver will give instructions about showering.  Wash your hands before touching ANYTHING related to the catheter or bag.  Using mild soap and warm water on a washcloth:  Clean the area closest to the catheter insertion site using a circular motion around the catheter.  Clean the catheter itself by wiping AWAY from the insertion site for several inches down the tube.  NEVER wipe upward as this could sweep bacteria up into the urethra (tube in your body that normally drains the bladder) and cause infection.  Place a small amount of sterile lubricant at the tip of the penis where the catheter is entering.  Taking Care of the Drainage Bags Two drainage bags may be taken home: a large overnight drainage bag, and a smaller leg bag which fits underneath clothing.  It is okay to wear the overnight bag at any time, but NEVER wear the smaller leg bag at night.  Keep the drainage bag well below the level of your bladder. This prevents backflow of urine into the bladder and allows the urine to drain freely.  Anchor the tubing to your leg to prevent pulling or tension on the catheter. Use tape or a leg strap provided by the hospital.  Empty the drainage bag when it is 1/2 to 3/4 full. Wash your hands before and after touching the bag.  Periodically check the tubing for kinks to make sure there is no pressure on the tubing which could restrict the flow of urine.  Changing the  Drainage Bags Cleanse both ends of the clean bag with alcohol  before changing.  Pinch off the rubber catheter to avoid urine spillage during the disconnection.  Disconnect the dirty bag and connect the clean one.  Empty the dirty bag carefully to avoid a urine spill.  Attach the new bag to the leg with tape or a leg strap.  Cleaning the Drainage Bags Whenever a drainage bag is disconnected, it must be cleaned quickly so it is ready for the next use.  Wash the bag in warm, soapy water.  Rinse the bag thoroughly with warm water.  Soak the bag for 30 minutes in a solution of white vinegar and water (1 cup vinegar to 1 quart warm water).  Rinse with warm water.  SEEK MEDICAL CARE IF:  You have chills or night sweats.  You are leaking around your catheter or have problems with your catheter. It is not uncommon to have sporadic leakage around your catheter as a result of bladder spasms. If the leakage stops, there is not much need  for concern. If you are uncertain, call your caregiver.  You develop side effects that you think are coming from your medicines.  SEEK IMMEDIATE MEDICAL CARE IF:  You are suddenly unable to urinate. Check to see if there are any kinks in the drainage tubing that may cause this. If you cannot find any kinks, call your caregiver immediately. This is an emergency.  You develop shortness of breath or chest pains.  Bleeding persists or clots develop in your urine.  You have a fever.  You develop pain in your back or over your lower belly (abdomen).  You develop pain or swelling in your legs.  Any problems you are having get worse rather than better.  MAKE SURE YOU:  Understand these instructions.  Will watch your condition.  Will get help right away if you are not doing well or get worse.

## 2023-09-13 NOTE — Anesthesia Procedure Notes (Signed)
 Procedure Name: Intubation Date/Time: 09/13/2023 9:35 AM  Performed by: Judd Northern, RNPre-anesthesia Checklist: Patient identified, Emergency Drugs available, Suction available, Patient being monitored and Timeout performed Patient Re-evaluated:Patient Re-evaluated prior to induction Oxygen Delivery Method: Circle system utilized Preoxygenation: Pre-oxygenation with 100% oxygen Induction Type: IV induction Ventilation: Mask ventilation without difficulty Laryngoscope Size: Mac and 3 Grade View: Grade I Tube type: Oral Tube size: 7.5 mm Number of attempts: 1 Airway Equipment and Method: Stylet Placement Confirmation: ETT inserted through vocal cords under direct vision, positive ETCO2, CO2 detector and breath sounds checked- equal and bilateral Secured at: 23 cm Tube secured with: Tape Dental Injury: Teeth and Oropharynx as per pre-operative assessment

## 2023-09-13 NOTE — Op Note (Signed)
 Preoperative diagnosis: BPH with urinary retention and AKI.  Postoperative diagnosis: Same   Procedure: Robotic water jet ablation of the prostate   Surgeon: Aimee Alf  Anesthesia: General   Indication for procedure: 68 year old male with a history of urinary retention causing hydronephrosis.  Catheter was placed UDS demonstrated good bladder function patient desires bladder outlet procedure he is here today for aqua ablation.  Prostate ultrasound demonstrated 46 g gland but cystoscopy demonstrated a 7 cm prostatic urethra.  Do not think prostate ultrasound was able to obtain all of the median lobe.  Findings:  EUA Cystoscopy revealed large median lobe which required some resection after aqua ablation took place. Total arc ablation time was 3: 24+3: 41 equals 7: 05  Description of procedure:  He was brought to the operating room and placed supine on the operating table.  After adequate anesthesia he was placed lithotomy position. Timeout was performed to confirm the patient and procedure. The TRUS Stepper was mounted to the Articulating Arm and secured to OR bed. The ultrasound probe was attached to the stepper. Exam under anesthesia was performed and the TRUS was inserted per rectum.  There was no resistance. The ultrasound probe was aligned, and confirmation made that the prostate is centered and aligned using both transverse and sagittal views. The bladder neck, verumontanum and the central/transition zones were identified.  Genitalia were prepped and draped in the usual sterile fashion. The 29F AQUABEAM Handpiece is inserted into the prostatic urethra and a complete cystoscopic evaluation was performed by inspecting the prostate, bladder, and identifying the location of the verumontanum/external sphincter. The AQUABEAM Handpiece was secured to the Handpiece Articulating Arm. Confirmed alignment of AQUABEAM Handpiece and TRUS Probe to be parallel and colinear. Confirmation that AQUABEAM  nozzle is centered and anterior of the bladder neck or the median lobe. The cystoscope was then retracted to visualize the verumontanum and external sphincter and the cystoscope tip was positioned just proximal to the external sphincter. Reconfirmed alignment of the TRUS probe with the AQUABEAM Handpiece and compression applied with TRUS probe. Horizontal alignment of the Handpiece waterjet nozzle was performed. The Aquablation treatment zones were planned utilizing real-time TRUS to visualize the contour of the prostate and the depth and radial angles of resection were defined in the transverse view. In the sagittal view, the AQUABEAM nozzle is identified and position registered with software. The treatment contours were then adjusted to conform to the intended resection margins. The median lobe, bladder neck and verumontanum were marked and confirmed in the treatment contour. The Aquablation Treatment was then started following the resection contour confirmed under ultrasound guidance. TOTAL AQUABLATION RESECTION TIME: 3: 24+3: 41 = 7: 05 Once Aquablation resection was complete the 24 French aqua beam handpiece was carefully removed.  The continuous-flow sheath with the visual obturator was passed and then the loop and handle.  The trigone and the ureteral orifices were identified.  Resection of some of the residual median lobe and bladder neck tissue was done.  The bladder neck was identified at 6:00 and this was taken up to 12:00 with fulguration of the bladder neck and prostate for hemostasis.  Slight amount of anterior tissue was resected.  Similarly from 6:00 up to 12:00 on the left side of the bladder neck was identified by resecting some of the ablated tissue to identify the bladder neck and cauterize any bleeding.  Some anterior tissue on the left was resected.  This created excellent hemostasis.  All the chips were evacuated.  Ureteral  orifices again identified and noted to be normal without injury.   The scope was backed out and a 24 [place] hematuria catheter was placed with 30 cc in the balloon.  The balloon was seated at the bladder neck and it was irrigated on light traction and noted to be clear to pink.  He was hooked up to CBI.  He was cleaned up and placed supine.  Catheter was placed on traction.  He was awakened and taken to the cover room in stable condition.  Complications: None  Blood loss: 100 mL  Specimens: None  Drains: 24 [place] three-way hematuria catheter with 30 cc in the balloon  Disposition: Patient stable to PACU

## 2023-09-13 NOTE — Transfer of Care (Signed)
 Immediate Anesthesia Transfer of Care Note  Patient: Jeff Shaw  Procedure(s) Performed: ABLATION, PROSTATE, TRANSURETHRAL, USING WATERJET  Patient Location: PACU  Anesthesia Type:General  Level of Consciousness: awake and alert   Airway & Oxygen Therapy: Patient Spontanous Breathing and Patient connected to face mask oxygen  Post-op Assessment: Report given to RN and Post -op Vital signs reviewed and stable  Post vital signs: Reviewed and stable  Last Vitals:  Vitals Value Taken Time  BP    Temp 36.4 C 09/13/23 1059  Pulse    Resp    SpO2      Last Pain:  Vitals:   09/13/23 0816  TempSrc:   PainSc: 0-No pain         Complications: No notable events documented.

## 2023-09-13 NOTE — Anesthesia Preprocedure Evaluation (Addendum)
 Anesthesia Evaluation  Patient identified by MRN, date of birth, ID band Patient awake    Reviewed: Allergy & Precautions, NPO status , Patient's Chart, lab work & pertinent test results  Airway Mallampati: II  TM Distance: >3 FB Neck ROM: Full    Dental  (+) Missing   Pulmonary former smoker   Pulmonary exam normal        Cardiovascular hypertension, Pt. on medications Normal cardiovascular exam     Neuro/Psych   Anxiety     negative neurological ROS     GI/Hepatic negative GI ROS, Neg liver ROS,,,  Endo/Other  negative endocrine ROS    Renal/GU Renal disease     Musculoskeletal  (+) Arthritis ,    Abdominal   Peds  Hematology negative hematology ROS (+)   Anesthesia Other Findings BENIGN PROSTATIC HYPERPLASIA WITH URINARY RETENTION  Reproductive/Obstetrics                             Anesthesia Physical Anesthesia Plan  ASA: 2  Anesthesia Plan: General   Post-op Pain Management:    Induction: Intravenous  PONV Risk Score and Plan: 2 and Ondansetron , Dexamethasone  and Treatment may vary due to age or medical condition  Airway Management Planned: Oral ETT  Additional Equipment:   Intra-op Plan:   Post-operative Plan: Extubation in OR  Informed Consent: I have reviewed the patients History and Physical, chart, labs and discussed the procedure including the risks, benefits and alternatives for the proposed anesthesia with the patient or authorized representative who has indicated his/her understanding and acceptance.     Dental advisory given  Plan Discussed with: CRNA  Anesthesia Plan Comments:        Anesthesia Quick Evaluation

## 2023-09-13 NOTE — Progress Notes (Signed)
 Pharmacy Antibiotic Note  Jeff Shaw is a 68 y.o. male admitted on 09/13/2023 for an Aquaablation procedure. Pharmacy has been consulted for gentamicin dosing for post-op surgical ppx (listed duration of 24hrs).  Patient received gentamicin 3mg /kg IV x1 pre-op today (5/15). Last BMP done 5/5 showed a Scr of 1.25, CrCl 57.6 ml/min. Per Rx note on gentamicin order, Surgeon requested 3mg /kg dose as opposed to typical 5mg /kg dose for CrCl >30 ml/min.    Plan: Repeat gentamicin 3mg /kg IV x1 tomorrow (5/16) @1030   Height: 5\' 8"  (172.7 cm) Weight: 82.6 kg (182 lb) IBW/kg (Calculated) : 68.4  Temp (24hrs), Avg:97.7 F (36.5 C), Min:97.6 F (36.4 C), Max:97.9 F (36.6 C)  No results for input(s): "WBC", "CREATININE", "LATICACIDVEN", "VANCOTROUGH", "VANCOPEAK", "VANCORANDOM", "GENTTROUGH", "GENTPEAK", "GENTRANDOM", "TOBRATROUGH", "TOBRAPEAK", "TOBRARND", "AMIKACINPEAK", "AMIKACINTROU", "AMIKACIN" in the last 168 hours.  Estimated Creatinine Clearance: 57.6 mL/min (A) (by C-G formula based on SCr of 1.25 mg/dL (H)).    Allergies  Allergen Reactions   Lisinopril Anaphylaxis and Swelling    Antimicrobials this admission: 5/15 gentamicin >> (5/16) 5/15 cefazolin  2g IV x1  Dose adjustments this admission: N/A  Microbiology results: None   Thank you for allowing pharmacy to be a part of this patient's care.  Roselee Cong, PharmD Clinical Pharmacist  5/15/20251:00 PM

## 2023-09-13 NOTE — Anesthesia Postprocedure Evaluation (Signed)
 Anesthesia Post Note  Patient: Jeff Shaw  Procedure(s) Performed: ABLATION, PROSTATE, TRANSURETHRAL, USING WATERJET     Patient location during evaluation: PACU Anesthesia Type: General Level of consciousness: awake Pain management: pain level controlled Vital Signs Assessment: post-procedure vital signs reviewed and stable Respiratory status: spontaneous breathing, nonlabored ventilation and respiratory function stable Cardiovascular status: blood pressure returned to baseline and stable Postop Assessment: no apparent nausea or vomiting Anesthetic complications: no   No notable events documented.  Last Vitals:  Vitals:   09/13/23 1331 09/13/23 1354  BP: 127/82 138/81  Pulse: 70 72  Resp: 15 18  Temp: 36.6 C 36.4 C  SpO2: 100% 100%    Last Pain:  Vitals:   09/13/23 1354  TempSrc: Oral  PainSc:                  Christia Coaxum P Dyana Magner

## 2023-09-14 DIAGNOSIS — R338 Other retention of urine: Secondary | ICD-10-CM | POA: Diagnosis not present

## 2023-09-14 DIAGNOSIS — M1990 Unspecified osteoarthritis, unspecified site: Secondary | ICD-10-CM | POA: Diagnosis not present

## 2023-09-14 DIAGNOSIS — Z79899 Other long term (current) drug therapy: Secondary | ICD-10-CM | POA: Diagnosis not present

## 2023-09-14 DIAGNOSIS — N401 Enlarged prostate with lower urinary tract symptoms: Secondary | ICD-10-CM | POA: Diagnosis not present

## 2023-09-14 DIAGNOSIS — I129 Hypertensive chronic kidney disease with stage 1 through stage 4 chronic kidney disease, or unspecified chronic kidney disease: Secondary | ICD-10-CM | POA: Diagnosis not present

## 2023-09-14 DIAGNOSIS — N1832 Chronic kidney disease, stage 3b: Secondary | ICD-10-CM | POA: Diagnosis not present

## 2023-09-14 DIAGNOSIS — Z87891 Personal history of nicotine dependence: Secondary | ICD-10-CM | POA: Diagnosis not present

## 2023-09-14 LAB — SURGICAL PATHOLOGY

## 2023-09-14 MED ORDER — HYDROCODONE-ACETAMINOPHEN 5-325 MG PO TABS
1.0000 | ORAL_TABLET | Freq: Four times a day (QID) | ORAL | 0 refills | Status: AC | PRN
Start: 2023-09-14 — End: 2023-09-17

## 2023-09-14 MED ORDER — OXYCODONE HCL 5 MG PO TABS: 5.0000 mg | ORAL_TABLET | Freq: Four times a day (QID) | ORAL | 0 refills | Status: DC | PRN

## 2023-09-14 MED ORDER — ALUM & MAG HYDROXIDE-SIMETH 200-200-20 MG/5ML PO SUSP
15.0000 mL | ORAL | Status: DC | PRN
  Administered 2023-09-14: 30 mL via ORAL
  Filled 2023-09-14: qty 30

## 2023-09-14 NOTE — Progress Notes (Signed)
   09/14/23 0910  TOC Brief Assessment  Insurance and Status Reviewed  Patient has primary care physician Yes  Home environment has been reviewed Resides in single family home with spouse  Prior level of function: Independent with ADLs at baseline  Prior/Current Home Services No current home services  Social Drivers of Health Review SDOH reviewed no interventions necessary  Readmission risk has been reviewed Yes  Transition of care needs no transition of care needs at this time

## 2023-09-14 NOTE — Discharge Summary (Addendum)
 Patient ID: Jeff Shaw MRN: [id number] DOB/AGE: Nov 03, 1955 68 y.o.  Admit date: 09/13/2023 Discharge date: 09/14/2023  Primary Care Physician:  Leesa Pulling, MD  Discharge Diagnoses:   Present on Admission:  BPH (benign prostatic hyperplasia)   Discharge Medications: Allergies as of 09/14/2023       Reactions   Lisinopril Anaphylaxis, Swelling        Medication List     STOP taking these medications    tamsulosin 0.4 MG Caps capsule Commonly known as: FLOMAX       TAKE these medications    atorvastatin 40 MG tablet Commonly known as: LIPITOR Take 40 mg by mouth daily.   citalopram 20 MG tablet Commonly known as: CELEXA Take 20 mg by mouth at bedtime.   diphenhydramine -acetaminophen  25-500 MG Tabs tablet Commonly known as: TYLENOL  PM Take 1 tablet by mouth at bedtime.   finasteride 5 MG tablet Commonly known as: PROSCAR Take 5 mg by mouth daily.   HYDROcodone -acetaminophen  5-325 MG tablet Commonly known as: NORCO/VICODIN Take 1 tablet by mouth every 6 (six) hours as needed for up to 3 days.   methocarbamol 750 MG tablet Commonly known as: ROBAXIN Take 1 tablet (750 mg total) by mouth 4 (four) times daily for 20 doses.   olmesartan 20 MG tablet Commonly known as: BENICAR Take 20 mg by mouth daily.   Opcon-A 0.027-0.315 % Soln Generic drug: Naphazoline-Pheniramine Place 1 drop into both eyes daily.   polyethylene glycol 17 g packet Commonly known as: MiraLax Take 17 g by mouth daily.         Significant Diagnostic Studies:  No results found.  Brief H and P: For complete details please refer to admission H and P, but in brief: Patient is a 68 year old male presenting to [facility] for scheduled aqua ablation of prostate and treatment of BPH.  The procedure was completed without complication and patient was discharged to the floor with continuous bladder irrigation for overnight monitoring.  On arrival this morning CBI was on the  lowest possible drip rate with only lightly tented irrigant/urine.  Patient was resting comfortably in bed having breakfast with no specific urologic complaints.  We reviewed home care and follow-up.  Patient understands to contact the clinic or present to the hospital for fever greater than 100.4, the inability to urinate, intractable pain, intractable nausea and vomiting, or significant new onset of bleeding.  Hospital Course:  Principal Problem:   BPH (benign prostatic hyperplasia)   Day of Discharge BP 122/75 (BP Location: Left Arm)   Pulse 63   Temp 98 F (36.7 C) (Oral)   Resp 18   Ht 5\' 8"  (1.727 m)   Wt 82.6 kg   SpO2 100%   BMI 27.67 kg/m   No results found for this or any previous visit (from the past 24 hours).  Physical Exam: General: Alert and awake oriented x3 not in any acute distress. CVS: no cyanosis Chest: normal rate and work of breathing Abdomen: soft nontender, non-distended GU: 3-way foley in place draining clear blood tinged urine.    Disposition:  Home today  Diet:  Regular  Activity:  as tolerated   DISCHARGE FOLLOW-UP   Follow-up Information     Sherlene Diss, PA-C Follow up on 09/21/2023.   Why: void trial/ catheter remoal 9:45 AM Contact information: [address] [place] [address] [phone]  Time spent on Discharge:   A total of 35 minutes was spent on planning, education, and discharge   Signed: Kyra Phy Arlyne Brandes 09/14/2023, 9:06 AM

## 2023-09-14 NOTE — Progress Notes (Signed)
 Discharge instructions per AVS reviewed with pt including medications and follow up appointments. Pt verbalized understanding. Reviewed foley catheter home care and foley to leg bag teaching. Pt able to verbalize and return demonstrate understanding. Honore Wipperfurth, Connell Degree, RN

## 2023-09-21 DIAGNOSIS — R338 Other retention of urine: Secondary | ICD-10-CM | POA: Diagnosis not present

## 2023-10-19 DIAGNOSIS — N3000 Acute cystitis without hematuria: Secondary | ICD-10-CM | POA: Diagnosis not present

## 2023-10-19 DIAGNOSIS — R3914 Feeling of incomplete bladder emptying: Secondary | ICD-10-CM | POA: Diagnosis not present

## 2023-10-19 DIAGNOSIS — R8271 Bacteriuria: Secondary | ICD-10-CM | POA: Diagnosis not present

## 2024-01-08 DIAGNOSIS — Z0001 Encounter for general adult medical examination with abnormal findings: Secondary | ICD-10-CM | POA: Diagnosis not present

## 2024-01-08 DIAGNOSIS — I10 Essential (primary) hypertension: Secondary | ICD-10-CM | POA: Diagnosis not present

## 2024-01-08 DIAGNOSIS — M1000 Idiopathic gout, unspecified site: Secondary | ICD-10-CM | POA: Diagnosis not present

## 2024-01-08 DIAGNOSIS — Z23 Encounter for immunization: Secondary | ICD-10-CM | POA: Diagnosis not present

## 2024-01-08 DIAGNOSIS — E7849 Other hyperlipidemia: Secondary | ICD-10-CM | POA: Diagnosis not present

## 2024-01-08 DIAGNOSIS — Z1389 Encounter for screening for other disorder: Secondary | ICD-10-CM | POA: Diagnosis not present

## 2024-01-21 DIAGNOSIS — N1832 Chronic kidney disease, stage 3b: Secondary | ICD-10-CM | POA: Diagnosis not present

## 2024-01-21 DIAGNOSIS — R338 Other retention of urine: Secondary | ICD-10-CM | POA: Diagnosis not present

## 2024-01-21 DIAGNOSIS — E349 Endocrine disorder, unspecified: Secondary | ICD-10-CM | POA: Diagnosis not present
